# Patient Record
Sex: Female | Born: 1935 | Race: White | Hispanic: No | Marital: Married | Smoking: Former smoker
Health system: Southern US, Community
[De-identification: ages and names within clinical notes are randomized; demographics above are authoritative.]

## PROBLEM LIST (undated history)

## (undated) DIAGNOSIS — R918 Other nonspecific abnormal finding of lung field: Secondary | ICD-10-CM

## (undated) DIAGNOSIS — R768 Other specified abnormal immunological findings in serum: Secondary | ICD-10-CM

## (undated) DIAGNOSIS — I7300 Raynaud's syndrome without gangrene: Secondary | ICD-10-CM

## (undated) DIAGNOSIS — J383 Other diseases of vocal cords: Secondary | ICD-10-CM

## (undated) DIAGNOSIS — A310 Pulmonary mycobacterial infection: Secondary | ICD-10-CM

## (undated) DIAGNOSIS — K449 Diaphragmatic hernia without obstruction or gangrene: Secondary | ICD-10-CM

## (undated) DIAGNOSIS — G250 Essential tremor: Secondary | ICD-10-CM

## (undated) DIAGNOSIS — M8580 Other specified disorders of bone density and structure, unspecified site: Secondary | ICD-10-CM

## (undated) DIAGNOSIS — H04123 Dry eye syndrome of bilateral lacrimal glands: Secondary | ICD-10-CM

## (undated) DIAGNOSIS — D219 Benign neoplasm of connective and other soft tissue, unspecified: Secondary | ICD-10-CM

## (undated) HISTORY — DX: Diaphragmatic hernia without obstruction or gangrene: K44.9

## (undated) HISTORY — DX: Other diseases of vocal cords: J38.3

## (undated) HISTORY — DX: Dry eye syndrome of bilateral lacrimal glands: H04.123

## (undated) HISTORY — PX: DILATION AND CURETTAGE, DIAGNOSTIC / THERAPEUTIC: SUR384

## (undated) HISTORY — DX: Essential tremor: G25.0

## (undated) HISTORY — DX: Other specified abnormal immunological findings in serum: R76.8

## (undated) HISTORY — DX: Benign neoplasm of connective and other soft tissue, unspecified: D21.9

## (undated) HISTORY — PX: OTHER SURGICAL HISTORY: SHX169

## (undated) HISTORY — DX: Other specified disorders of bone density and structure, unspecified site: M85.80

## (undated) HISTORY — DX: Raynaud's syndrome without gangrene: I73.00

## (undated) HISTORY — DX: Pulmonary mycobacterial infection: A31.0

## (undated) HISTORY — DX: Other nonspecific abnormal finding of lung field: R91.8

---

## 1997-11-29 ENCOUNTER — Emergency Department (HOSPITAL_COMMUNITY): Admission: EM | Admit: 1997-11-29 | Discharge: 1997-11-29 | Payer: Self-pay | Admitting: Emergency Medicine

## 1997-12-12 ENCOUNTER — Emergency Department (HOSPITAL_COMMUNITY): Admission: EM | Admit: 1997-12-12 | Discharge: 1997-12-12 | Payer: Self-pay | Admitting: Internal Medicine

## 1998-07-08 ENCOUNTER — Other Ambulatory Visit: Admission: RE | Admit: 1998-07-08 | Discharge: 1998-07-08 | Payer: Self-pay | Admitting: Internal Medicine

## 1999-05-12 ENCOUNTER — Ambulatory Visit (HOSPITAL_COMMUNITY): Admission: RE | Admit: 1999-05-12 | Discharge: 1999-05-12 | Payer: Self-pay | Admitting: Obstetrics and Gynecology

## 1999-05-12 ENCOUNTER — Encounter: Payer: Self-pay | Admitting: Obstetrics and Gynecology

## 2000-10-18 ENCOUNTER — Other Ambulatory Visit: Admission: RE | Admit: 2000-10-18 | Discharge: 2000-10-18 | Payer: Self-pay | Admitting: Obstetrics and Gynecology

## 2001-07-13 ENCOUNTER — Ambulatory Visit: Admission: RE | Admit: 2001-07-13 | Discharge: 2001-07-13 | Payer: Self-pay | Admitting: Internal Medicine

## 2003-03-08 HISTORY — PX: COLONOSCOPY: SHX174

## 2004-01-22 ENCOUNTER — Ambulatory Visit: Payer: Self-pay | Admitting: Internal Medicine

## 2004-07-20 ENCOUNTER — Ambulatory Visit: Payer: Self-pay | Admitting: Internal Medicine

## 2004-09-13 ENCOUNTER — Ambulatory Visit: Payer: Self-pay | Admitting: Internal Medicine

## 2004-10-13 ENCOUNTER — Ambulatory Visit: Payer: Self-pay | Admitting: Internal Medicine

## 2005-01-13 ENCOUNTER — Ambulatory Visit: Payer: Self-pay | Admitting: Internal Medicine

## 2005-03-07 DIAGNOSIS — R768 Other specified abnormal immunological findings in serum: Secondary | ICD-10-CM

## 2005-03-07 HISTORY — DX: Other specified abnormal immunological findings in serum: R76.8

## 2005-03-15 ENCOUNTER — Ambulatory Visit: Payer: Self-pay | Admitting: Internal Medicine

## 2005-03-16 ENCOUNTER — Ambulatory Visit: Payer: Self-pay | Admitting: *Deleted

## 2005-03-25 ENCOUNTER — Ambulatory Visit: Payer: Self-pay | Admitting: Internal Medicine

## 2005-04-01 ENCOUNTER — Ambulatory Visit: Payer: Self-pay | Admitting: Critical Care Medicine

## 2005-04-11 ENCOUNTER — Ambulatory Visit: Payer: Self-pay | Admitting: Pulmonary Disease

## 2005-04-12 ENCOUNTER — Encounter (INDEPENDENT_AMBULATORY_CARE_PROVIDER_SITE_OTHER): Payer: Self-pay | Admitting: Specialist

## 2005-04-12 ENCOUNTER — Ambulatory Visit: Admission: RE | Admit: 2005-04-12 | Discharge: 2005-04-12 | Payer: Self-pay | Admitting: Critical Care Medicine

## 2005-04-12 ENCOUNTER — Ambulatory Visit: Payer: Self-pay | Admitting: Critical Care Medicine

## 2005-04-20 ENCOUNTER — Ambulatory Visit: Payer: Self-pay | Admitting: Critical Care Medicine

## 2005-05-04 ENCOUNTER — Ambulatory Visit: Payer: Self-pay | Admitting: Critical Care Medicine

## 2005-06-06 ENCOUNTER — Ambulatory Visit: Payer: Self-pay | Admitting: Critical Care Medicine

## 2005-06-15 ENCOUNTER — Ambulatory Visit: Payer: Self-pay | Admitting: Critical Care Medicine

## 2005-07-22 ENCOUNTER — Ambulatory Visit: Payer: Self-pay | Admitting: Critical Care Medicine

## 2005-08-15 ENCOUNTER — Ambulatory Visit: Payer: Self-pay | Admitting: Cardiology

## 2005-08-29 ENCOUNTER — Ambulatory Visit: Payer: Self-pay | Admitting: Critical Care Medicine

## 2005-09-27 ENCOUNTER — Ambulatory Visit: Payer: Self-pay | Admitting: Critical Care Medicine

## 2005-10-10 ENCOUNTER — Ambulatory Visit: Payer: Self-pay | Admitting: Critical Care Medicine

## 2005-10-11 ENCOUNTER — Ambulatory Visit: Payer: Self-pay | Admitting: Internal Medicine

## 2005-10-19 ENCOUNTER — Ambulatory Visit: Payer: Self-pay | Admitting: Internal Medicine

## 2005-11-22 ENCOUNTER — Ambulatory Visit: Payer: Self-pay | Admitting: Critical Care Medicine

## 2006-01-02 ENCOUNTER — Ambulatory Visit: Payer: Self-pay | Admitting: Critical Care Medicine

## 2006-01-17 ENCOUNTER — Ambulatory Visit: Payer: Self-pay | Admitting: Internal Medicine

## 2006-01-19 ENCOUNTER — Ambulatory Visit: Payer: Self-pay | Admitting: Internal Medicine

## 2006-02-06 ENCOUNTER — Ambulatory Visit: Payer: Self-pay | Admitting: Internal Medicine

## 2006-02-13 ENCOUNTER — Ambulatory Visit: Payer: Self-pay | Admitting: Internal Medicine

## 2006-02-16 ENCOUNTER — Encounter: Admission: RE | Admit: 2006-02-16 | Discharge: 2006-02-16 | Payer: Self-pay | Admitting: Internal Medicine

## 2006-02-22 ENCOUNTER — Encounter: Admission: RE | Admit: 2006-02-22 | Discharge: 2006-02-22 | Payer: Self-pay | Admitting: Internal Medicine

## 2006-02-22 ENCOUNTER — Ambulatory Visit: Payer: Self-pay | Admitting: Internal Medicine

## 2006-05-05 ENCOUNTER — Encounter: Admission: RE | Admit: 2006-05-05 | Discharge: 2006-05-05 | Payer: Self-pay | Admitting: Internal Medicine

## 2006-06-06 DIAGNOSIS — R918 Other nonspecific abnormal finding of lung field: Secondary | ICD-10-CM

## 2006-06-06 HISTORY — DX: Other nonspecific abnormal finding of lung field: R91.8

## 2006-06-12 DIAGNOSIS — D259 Leiomyoma of uterus, unspecified: Secondary | ICD-10-CM

## 2006-06-12 DIAGNOSIS — M949 Disorder of cartilage, unspecified: Secondary | ICD-10-CM

## 2006-06-12 DIAGNOSIS — G250 Essential tremor: Secondary | ICD-10-CM

## 2006-06-12 DIAGNOSIS — G252 Other specified forms of tremor: Secondary | ICD-10-CM

## 2006-06-12 DIAGNOSIS — M899 Disorder of bone, unspecified: Secondary | ICD-10-CM | POA: Insufficient documentation

## 2006-06-12 DIAGNOSIS — I7300 Raynaud's syndrome without gangrene: Secondary | ICD-10-CM | POA: Insufficient documentation

## 2006-06-12 DIAGNOSIS — F438 Other reactions to severe stress: Secondary | ICD-10-CM | POA: Insufficient documentation

## 2006-06-12 DIAGNOSIS — R930 Abnormal findings on diagnostic imaging of skull and head, not elsewhere classified: Secondary | ICD-10-CM

## 2006-06-28 ENCOUNTER — Ambulatory Visit: Payer: Self-pay | Admitting: Critical Care Medicine

## 2006-06-30 ENCOUNTER — Ambulatory Visit: Payer: Self-pay | Admitting: Cardiology

## 2006-11-20 ENCOUNTER — Telehealth (INDEPENDENT_AMBULATORY_CARE_PROVIDER_SITE_OTHER): Payer: Self-pay | Admitting: *Deleted

## 2006-12-18 ENCOUNTER — Ambulatory Visit: Payer: Self-pay | Admitting: Internal Medicine

## 2006-12-22 DIAGNOSIS — A310 Pulmonary mycobacterial infection: Secondary | ICD-10-CM

## 2007-02-08 ENCOUNTER — Ambulatory Visit: Payer: Self-pay | Admitting: Internal Medicine

## 2007-02-26 ENCOUNTER — Encounter: Payer: Self-pay | Admitting: Internal Medicine

## 2007-02-26 ENCOUNTER — Ambulatory Visit: Payer: Self-pay | Admitting: Internal Medicine

## 2007-02-26 DIAGNOSIS — R042 Hemoptysis: Secondary | ICD-10-CM | POA: Insufficient documentation

## 2007-02-28 ENCOUNTER — Encounter: Payer: Self-pay | Admitting: Internal Medicine

## 2007-02-28 ENCOUNTER — Ambulatory Visit: Payer: Self-pay | Admitting: Internal Medicine

## 2007-03-07 ENCOUNTER — Ambulatory Visit: Payer: Self-pay | Admitting: Pulmonary Disease

## 2007-03-07 ENCOUNTER — Ambulatory Visit: Payer: Self-pay | Admitting: Internal Medicine

## 2007-03-11 LAB — CONVERTED CEMR LAB
Albumin: 3.3 g/dL — ABNORMAL LOW (ref 3.5–5.2)
Alkaline Phosphatase: 75 units/L (ref 39–117)
Total Bilirubin: 0.5 mg/dL (ref 0.3–1.2)

## 2007-03-21 ENCOUNTER — Ambulatory Visit: Payer: Self-pay | Admitting: Internal Medicine

## 2007-03-28 ENCOUNTER — Telehealth: Payer: Self-pay | Admitting: Critical Care Medicine

## 2007-04-17 ENCOUNTER — Ambulatory Visit: Payer: Self-pay | Admitting: Cardiovascular Disease

## 2007-04-27 ENCOUNTER — Telehealth: Payer: Self-pay | Admitting: Critical Care Medicine

## 2007-04-27 ENCOUNTER — Ambulatory Visit: Payer: Self-pay | Admitting: Critical Care Medicine

## 2007-04-27 DIAGNOSIS — J383 Other diseases of vocal cords: Secondary | ICD-10-CM | POA: Insufficient documentation

## 2007-04-27 LAB — CONVERTED CEMR LAB
Bilirubin, Direct: 0.1 mg/dL (ref 0.0–0.3)
Eosinophils Absolute: 0.7 10*3/uL — ABNORMAL HIGH (ref 0.0–0.6)
Eosinophils Relative: 13.3 % — ABNORMAL HIGH (ref 0.0–5.0)
HCT: 38.1 % (ref 36.0–46.0)
Lymphocytes Relative: 20.9 % (ref 12.0–46.0)
MCV: 93.5 fL (ref 78.0–100.0)
Monocytes Absolute: 0.8 10*3/uL — ABNORMAL HIGH (ref 0.2–0.7)
Neutro Abs: 2.9 10*3/uL (ref 1.4–7.7)
Neutrophils Relative %: 51.3 % (ref 43.0–77.0)
Total Protein: 7.5 g/dL (ref 6.0–8.3)
WBC: 5.5 10*3/uL (ref 4.5–10.5)

## 2007-05-23 ENCOUNTER — Telehealth (INDEPENDENT_AMBULATORY_CARE_PROVIDER_SITE_OTHER): Payer: Self-pay | Admitting: *Deleted

## 2007-06-01 ENCOUNTER — Encounter: Payer: Self-pay | Admitting: Internal Medicine

## 2007-06-19 ENCOUNTER — Encounter: Payer: Self-pay | Admitting: Internal Medicine

## 2007-06-26 ENCOUNTER — Ambulatory Visit: Payer: Self-pay | Admitting: Critical Care Medicine

## 2007-06-26 LAB — CONVERTED CEMR LAB: Hemoglobin: 12.6 g/dL

## 2007-06-27 ENCOUNTER — Telehealth (INDEPENDENT_AMBULATORY_CARE_PROVIDER_SITE_OTHER): Payer: Self-pay | Admitting: *Deleted

## 2007-06-27 LAB — CONVERTED CEMR LAB
AST: 30 units/L (ref 0–37)
Albumin: 3.4 g/dL — ABNORMAL LOW (ref 3.5–5.2)
Alkaline Phosphatase: 63 units/L (ref 39–117)
BUN: 14 mg/dL (ref 6–23)
Bilirubin, Direct: 0.2 mg/dL (ref 0.0–0.3)
Chloride: 103 meq/L (ref 96–112)
Eosinophils Absolute: 0.5 10*3/uL (ref 0.0–0.7)
Eosinophils Relative: 12.1 % — ABNORMAL HIGH (ref 0.0–5.0)
GFR calc non Af Amer: 65 mL/min
MCV: 94.3 fL (ref 78.0–100.0)
Monocytes Relative: 8.6 % (ref 3.0–12.0)
Neutrophils Relative %: 45.7 % (ref 43.0–77.0)
Platelets: 261 10*3/uL (ref 150–400)
Potassium: 4.6 meq/L (ref 3.5–5.1)
Sodium: 137 meq/L (ref 135–145)
WBC: 4.4 10*3/uL — ABNORMAL LOW (ref 4.5–10.5)

## 2007-09-26 ENCOUNTER — Ambulatory Visit: Payer: Self-pay | Admitting: Critical Care Medicine

## 2007-09-26 LAB — CONVERTED CEMR LAB
Basophils Absolute: 0 10*3/uL (ref 0.0–0.1)
Bilirubin, Direct: 0.1 mg/dL (ref 0.0–0.3)
Eosinophils Absolute: 0.6 10*3/uL (ref 0.0–0.7)
HCT: 37.5 % (ref 36.0–46.0)
MCHC: 34.3 g/dL (ref 30.0–36.0)
MCV: 94.2 fL (ref 78.0–100.0)
Monocytes Absolute: 0.7 10*3/uL (ref 0.1–1.0)
Monocytes Relative: 13.7 % — ABNORMAL HIGH (ref 3.0–12.0)
Neutro Abs: 2.8 10*3/uL (ref 1.4–7.7)
Platelets: 284 10*3/uL (ref 150–400)
RDW: 13.5 % (ref 11.5–14.6)
Total Bilirubin: 0.6 mg/dL (ref 0.3–1.2)

## 2007-09-28 ENCOUNTER — Telehealth (INDEPENDENT_AMBULATORY_CARE_PROVIDER_SITE_OTHER): Payer: Self-pay | Admitting: *Deleted

## 2007-10-02 ENCOUNTER — Encounter: Payer: Self-pay | Admitting: Internal Medicine

## 2007-12-12 ENCOUNTER — Encounter: Payer: Self-pay | Admitting: Internal Medicine

## 2008-01-16 ENCOUNTER — Ambulatory Visit: Payer: Self-pay | Admitting: Internal Medicine

## 2008-02-22 ENCOUNTER — Encounter: Payer: Self-pay | Admitting: Internal Medicine

## 2008-05-09 ENCOUNTER — Encounter: Payer: Self-pay | Admitting: Internal Medicine

## 2008-05-12 ENCOUNTER — Encounter: Payer: Self-pay | Admitting: Internal Medicine

## 2008-08-27 ENCOUNTER — Encounter (INDEPENDENT_AMBULATORY_CARE_PROVIDER_SITE_OTHER): Payer: Self-pay | Admitting: *Deleted

## 2008-11-03 ENCOUNTER — Ambulatory Visit: Payer: Self-pay | Admitting: Internal Medicine

## 2008-11-03 DIAGNOSIS — J392 Other diseases of pharynx: Secondary | ICD-10-CM

## 2008-12-15 ENCOUNTER — Encounter: Payer: Self-pay | Admitting: Internal Medicine

## 2008-12-24 ENCOUNTER — Telehealth (INDEPENDENT_AMBULATORY_CARE_PROVIDER_SITE_OTHER): Payer: Self-pay | Admitting: *Deleted

## 2008-12-24 ENCOUNTER — Ambulatory Visit: Payer: Self-pay | Admitting: Family Medicine

## 2008-12-24 DIAGNOSIS — M25579 Pain in unspecified ankle and joints of unspecified foot: Secondary | ICD-10-CM

## 2009-01-13 ENCOUNTER — Encounter: Payer: Self-pay | Admitting: Internal Medicine

## 2009-06-05 ENCOUNTER — Encounter: Payer: Self-pay | Admitting: Internal Medicine

## 2009-11-02 ENCOUNTER — Encounter: Payer: Self-pay | Admitting: Internal Medicine

## 2009-12-02 ENCOUNTER — Ambulatory Visit: Payer: Self-pay | Admitting: Internal Medicine

## 2009-12-22 ENCOUNTER — Encounter: Payer: Self-pay | Admitting: Internal Medicine

## 2010-03-28 ENCOUNTER — Encounter: Payer: Self-pay | Admitting: Internal Medicine

## 2010-04-06 NOTE — Letter (Signed)
Summary: [facility]  [facility]   Imported By: Lanelle Bal 11/17/2009 09:41:14  _____________________________________________________________________  External Attachment:    Type:   Image     Comment:   External Document

## 2010-04-06 NOTE — Op Note (Signed)
Summary: Botox Injection/[facility]  Botox Injection/[facility]   Imported By: Lanelle Bal 06/15/2009 13:46:30  _____________________________________________________________________  External Attachment:    Type:   Image     Comment:   External Document

## 2010-04-06 NOTE — Op Note (Signed)
Summary: Botox Injection/[facility]  Botox Injection/[facility]   Imported By: Lanelle Bal 06/16/2009 11:16:15  _____________________________________________________________________  External Attachment:    Type:   Image     Comment:   External Document

## 2010-04-06 NOTE — Assessment & Plan Note (Signed)
Summary: FLU SHOT   Nurse Visit   Allergies: 1)  ! Cipro 2)  ! Pyridium  Orders Added: 1)  Flu Vaccine 44yrs + MEDICARE PATIENTS [Q2039] 2)  Administration Flu vaccine - MCR [G0008] Flu Vaccine Consent Questions     Do you have a history of severe allergic reactions to this vaccine? no    Any prior history of allergic reactions to egg and/or gelatin? no    Do you have a sensitivity to the preservative Thimersol? no    Do you have a past history of Guillan-Barre Syndrome? no    Do you currently have an acute febrile illness? no    Have you ever had a severe reaction to latex? no    Vaccine information given and explained to patient? yes    Are you currently pregnant? no    Lot Number:[id number]   Exp Date:09/04/2010   Site Given  Left Deltoid IM

## 2010-04-06 NOTE — Letter (Signed)
Summary: [facility]  [facility]   Imported By: Lanelle Bal 06/16/2009 11:16:54  _____________________________________________________________________  External Attachment:    Type:   Image     Comment:   External Document

## 2010-05-31 ENCOUNTER — Ambulatory Visit (INDEPENDENT_AMBULATORY_CARE_PROVIDER_SITE_OTHER): Payer: Medicare Other | Admitting: Adult Health

## 2010-05-31 ENCOUNTER — Telehealth: Payer: Self-pay | Admitting: Adult Health

## 2010-05-31 ENCOUNTER — Encounter: Payer: Self-pay | Admitting: Adult Health

## 2010-05-31 ENCOUNTER — Ambulatory Visit (INDEPENDENT_AMBULATORY_CARE_PROVIDER_SITE_OTHER)
Admission: RE | Admit: 2010-05-31 | Discharge: 2010-05-31 | Disposition: A | Discharge status: Home / Self Care | Payer: Medicare Other | Source: Ambulatory Visit | Attending: Adult Health | Admitting: Adult Health

## 2010-05-31 VITALS — BP 130/80 | HR 64 | Temp 99.1°F | Ht 65.0 in | Wt 125.0 lb

## 2010-05-31 DIAGNOSIS — J984 Other disorders of lung: Secondary | ICD-10-CM

## 2010-05-31 DIAGNOSIS — R042 Hemoptysis: Secondary | ICD-10-CM

## 2010-05-31 DIAGNOSIS — A310 Pulmonary mycobacterial infection: Secondary | ICD-10-CM

## 2010-05-31 MED ORDER — MOXIFLOXACIN HCL 400 MG PO TABS: 400.0000 mg | ORAL_TABLET | Freq: Every day | ORAL | Status: AC

## 2010-05-31 NOTE — Assessment & Plan Note (Signed)
Check sputum cx today cxr pending.

## 2010-05-31 NOTE — Progress Notes (Signed)
  Subjective:    Patient ID: Heather [name], female    DOB: 17-Feb-1930, 77 y.o.   MRN: [id number]  HPI Comments: 77 yo female with previous hx of  pulmonary MAC infection.    4/2009The patient has cycled off systemic antibiotics in September 2008.  Over the period of the fall of 2008 the patient did well until she began having hemoptysis over the holidays of 2008.  Patient was seen by my partner, on 03/21/07 , and a sputum culture was obtained, showing positivity for Mycobacteruim intracelluare avium infection.    initiated treatment for MAI with triple therapy on March 31, 2007.   CT scan of the chest does show disease in the right upper and left upper lung zones.  Left upper lung infiltrate has improved since starting therapy, but the right lung shows no change in fact, may be some progression in the apices of the right upper lobe. The pt has tolerated triple therapy well without any side effects.  Labs in 2/20 were normal.   Pt is seeking some assistance in paying for her chronic meds.  09/26/07 : Now improved with less cough.  no chest pain.  No f/c/s.  tolerating MAC meds ok.  05/31/2010 Acute OV--Pt walked in today for acute office visit. She was last seen in 2009. Says she has been doing well with no flare in cough/hemoptysis. Today she coughed up blood tinged mucus x 3. Mucus is mixed with red/brown blood. No known fever/chills however on arrival temp 99.1. She denies recent abx, travel or chest pain,or calf swelling/pain. No otc meds used.      Review of Systems   Constitutional:   No  weight loss, night sweats,  Fevers, chills, fatigue, lassitude. HEENT:   No headaches,  Difficulty swallowing,   Sore throat,                No sneezing, itching, ear ache, nasal congestion, post nasal drip,   CV:  No chest pain,  Orthopnea, PND, swelling in lower extremities, anasarca, dizziness, palpitations  GI  No heartburn, indigestion, abdominal pain, nausea, vomiting, diarrhea, change in bowel  habits, loss of appetite  Resp: No shortness of breath with exertion or at rest.  No excess mucus, no productive cough Skin: no rash or lesions.  GU: no dysuria, change in color of urine, no urgency or frequency.  No flank pain.  MS:  No joint pain or swelling.  No decreased range of motion.  No back pain.  Psych:  No change in mood or affect. No depression or anxiety.  No memory loss.                        Objective:   Physical Exam Gen: Pleasant, well-nourished, in no distress,  normal affect  ENT: No lesions,  mouth clear,  oropharynx clear, no postnasal drip, hoarse   Neck: No JVD, no TMG, no carotid bruits  Lungs: No use of accessory muscles, no dullness to percussion, clear without rales or rhonchi  Cardiovascular: RRR, heart sounds normal, no murmur or gallops, no peripheral edema  Abdomen: soft and NT, no HSM,  BS normal  Musculoskeletal: No deformities, no cyanosis or clubbing  Neuro: alert, non focal  Skin: Warm, no lesions or rashes             Assessment & Plan:

## 2010-05-31 NOTE — Patient Instructions (Addendum)
Avelox 400mg  daily for 7 days- take with food  Eat yogurt daily while on antibiotic  Mucinex DM Twice daily  For cough /congestion  Hold Aspirin if taking.  Hold fosamax.  We are going to check xray and sputum culture.  follow up in 1 week Dr. Delford Field  [place]

## 2010-05-31 NOTE — Assessment & Plan Note (Signed)
CXR pending  Begin Avelox x 7 days for possible PNA  Hold asa  And fosamax

## 2010-06-01 NOTE — Progress Notes (Signed)
Addended by: Rubye Oaks on: 06/01/2010 02:15 PM   Modules accepted: Orders

## 2010-06-02 ENCOUNTER — Other Ambulatory Visit: Payer: Medicare Other

## 2010-06-02 ENCOUNTER — Ambulatory Visit (INDEPENDENT_AMBULATORY_CARE_PROVIDER_SITE_OTHER)
Admission: RE | Admit: 2010-06-02 | Discharge: 2010-06-02 | Disposition: A | Discharge status: Home / Self Care | Payer: Medicare Other | Source: Ambulatory Visit | Attending: Adult Health | Admitting: Adult Health

## 2010-06-02 DIAGNOSIS — A310 Pulmonary mycobacterial infection: Secondary | ICD-10-CM

## 2010-06-02 DIAGNOSIS — J984 Other disorders of lung: Secondary | ICD-10-CM

## 2010-06-02 DIAGNOSIS — R042 Hemoptysis: Secondary | ICD-10-CM

## 2010-06-03 ENCOUNTER — Telehealth: Payer: Self-pay | Admitting: Critical Care Medicine

## 2010-06-03 NOTE — Telephone Encounter (Signed)
Dr. Delford Field, are you wanting to work pt in on 4/11?

## 2010-06-03 NOTE — Telephone Encounter (Signed)
CT chest abnormal and the patient needs to get in to see me very soon   06/16/10 is ok with me

## 2010-06-03 NOTE — Telephone Encounter (Signed)
Yes , work this patient into the schedule

## 2010-06-03 NOTE — Telephone Encounter (Signed)
Called, spoke with pt.  She was informed Dr. Delford Field would like her to come in for follow up.  OV scheduled for 06/16/10 at 10:30am with Dr. Delford Field.  Pt aware and will call back if something needed prior to this.

## 2010-06-03 NOTE — Telephone Encounter (Signed)
Pt saw TP on 05-31-10. Carron Curie, CMA

## 2010-06-07 NOTE — Progress Notes (Signed)
Pt to return cx

## 2010-06-07 NOTE — Progress Notes (Signed)
Pt did not return sputum cx , pt called and reminded to return

## 2010-06-15 ENCOUNTER — Encounter: Payer: Self-pay | Admitting: Critical Care Medicine

## 2010-06-16 ENCOUNTER — Other Ambulatory Visit (INDEPENDENT_AMBULATORY_CARE_PROVIDER_SITE_OTHER): Payer: Medicare Other

## 2010-06-16 ENCOUNTER — Encounter: Payer: Self-pay | Admitting: Critical Care Medicine

## 2010-06-16 ENCOUNTER — Ambulatory Visit (INDEPENDENT_AMBULATORY_CARE_PROVIDER_SITE_OTHER): Payer: Medicare Other | Admitting: Critical Care Medicine

## 2010-06-16 VITALS — BP 110/70 | HR 80 | Temp 98.3°F | Ht 65.0 in | Wt 125.6 lb

## 2010-06-16 DIAGNOSIS — A310 Pulmonary mycobacterial infection: Secondary | ICD-10-CM

## 2010-06-16 LAB — HEPATIC FUNCTION PANEL
AST: 25 U/L (ref 0–37)
Alkaline Phosphatase: 55 U/L (ref 39–117)
Bilirubin, Direct: 0.1 mg/dL (ref 0.0–0.3)

## 2010-06-16 MED ORDER — ETHAMBUTOL HCL 400 MG PO TABS: ORAL_TABLET | ORAL | Status: DC

## 2010-06-16 MED ORDER — AZITHROMYCIN 500 MG PO TABS: ORAL_TABLET | ORAL | Status: DC

## 2010-06-16 MED ORDER — RIFABUTIN 150 MG PO CAPS: ORAL_CAPSULE | ORAL | Status: DC

## 2010-06-16 NOTE — Progress Notes (Signed)
Quick Note:  Called, spoke with pt. She was informed labs are ok. No changed in meds, and ok to start new abx. She verbalized understanding of this and voiced no further questions at this time. ______

## 2010-06-16 NOTE — Progress Notes (Signed)
Quick Note:  Call pt and tell her labs are ok, No change in medications, ok to start new antibiotics ______

## 2010-06-16 NOTE — Progress Notes (Deleted)
  Subjective:    Patient ID: Heather [name], female    DOB: 02/06/1930, 77 y.o.   MRN: [id number]  HPI This is a 77 year old, white female, seen in return follow-up for pulmonary MAC infection. The patient has cycled off systemic antibiotics in September 2008. Over the period of the fall of 2008 the patient did well until she began having hemoptysis over the holidays of 2008. Patient was seen by my partner, on 03/21/07 , and a sputum culture was obtained, showing positivity for Mycobacteruim intracelluare avium infection. We initiated treatment for MAI with triple therapy on March 31, 2007. Since that time, the patient's head. No fever, chills, or sweats. There is no further hemoptysis. She is not short of breath. There is no chest pain. Patient returns for pulmonary follow-up and assessment. The last OV was 04/27/07. No new changes since the last OV is noted.  CT scan of the chest does show disease in the right upper and left upper lung zones. Left upper lung infiltrate has improved since starting therapy, but the right lung shows no change in fact, may be some progression in the apices of the right upper lobe.  The pt has tolerated triple therapy well without any side effects. Labs in 2/20 were normal. Pt is seeking some assistance in paying for her chronic meds.  7/22: Now improved with less cough. no chest pain. No f/c/s. tolerating MAC meds ok.   06/16/2010 Not seen since 2009 as above; Since last ov 2009, was on mycobutin/zmax/eth and took for 7months.  2009 ended.  Was ok until noted more cough and produced bloody mucus.  Saw NP    Review of Systems Constitutional:   No  weight loss, night sweats,  Fevers, chills, fatigue, lassitude. HEENT:   No headaches,  Difficulty swallowing,  Tooth/dental problems,  Sore throat,                No sneezing, itching, ear ache, nasal congestion, post nasal drip,   CV:  No chest pain,  Orthopnea, PND, swelling in lower extremities, anasarca, dizziness,  palpitations  GI  No heartburn, indigestion, abdominal pain, nausea, vomiting, diarrhea, change in bowel habits, loss of appetite  Resp: No shortness of breath with exertion or at rest.  No excess mucus, no productive cough,  No non-productive cough,  No coughing up of blood.  No change in color of mucus.  No wheezing.  No chest wall deformity  Skin: no rash or lesions.  GU: no dysuria, change in color of urine, no urgency or frequency.  No flank pain.  MS:  No joint pain or swelling.  No decreased range of motion.  No back pain.  Psych:  No change in mood or affect. No depression or anxiety.  No memory loss.     Objective:   Physical Exam Gen: Pleasant, well-nourished, in no distress,  normal affect  ENT: No lesions,  mouth clear,  oropharynx clear, no postnasal drip  Neck: No JVD, no TMG, no carotid bruits  Lungs: No use of accessory muscles, no dullness to percussion, clear without rales or rhonchi  Cardiovascular: RRR, heart sounds normal, no murmur or gallops, no peripheral edema  Abdomen: soft and NT, no HSM,  BS normal  Musculoskeletal: No deformities, no cyanosis or clubbing  Neuro: alert, non focal  Skin: Warm, no lesions or rashes        Assessment & Plan:

## 2010-06-16 NOTE — Patient Instructions (Signed)
We will start Mycobutin 4 tabs three times weekly,  Myambutol 3 tabs three times weekly,  zithromax one three times weekly Labs today Return 6weeks

## 2010-06-16 NOTE — Progress Notes (Signed)
Subjective:    Patient ID: Heather [name], female    DOB: 1929/04/18, 77 y.o.   MRN: [id number]  HPI Comments: 77 yo female with previous hx of  pulmonary MAC infection.    4/2009The patient has cycled off systemic antibiotics in September 2008.  Over the period of the fall of 2008 the patient did well until she began having hemoptysis over the holidays of 2008.  Patient was seen by my partner, on 03/21/07 , and a sputum culture was obtained, showing positivity for Mycobacteruim intracelluare avium infection.    initiated treatment for MAI with triple therapy on March 31, 2007.   CT scan of the chest does show disease in the right upper and left upper lung zones.  Left upper lung infiltrate has improved since starting therapy, but the right lung shows no change in fact, may be some progression in the apices of the right upper lobe. The pt has tolerated triple therapy well without any side effects.  Labs in 2/20 were normal.   Pt is seeking some assistance in paying for her chronic meds.  09/26/07 : Now improved with less cough.  no chest pain.  No f/c/s.  tolerating MAC meds ok.  05/31/2010 Acute OV--Pt walked in today for acute office visit. She was last seen in 2009. Says she has been doing well with no flare in cough/hemoptysis. Today she coughed up blood tinged mucus x 3. Mucus is mixed with red/brown blood. No known fever/chills however on arrival temp 99.1. She denies recent abx, travel or chest pain,or calf swelling/pain. No otc meds used.   Cough Associated symptoms include shortness of breath.  Shortness of Breath   06/16/2010 At last ov saw NP and rx avelox  .  Since that OV.  Still has phlegm, yellow to clear.  No blood since one time.  Had a CT chest and CXR CT was abn. As follows: Comparison: 04/17/2007  Findings: No enlarged axillary or supraclavicular lymph nodes.  No enlarged mediastinal or hilar lymph nodes identified.  No pericardial or pleural effusion identified.  Multifocal  patchy areas of consolidation and tree in bud nodularity  is again noted in both lungs. Associated cylindrical type  bronchiectasis is identified within these areas. Progressive  consolidative change, nodularity and bronchiectasis is noted within  the medial right upper lobe. Consolidative change within the  periphery of the right upper lobe demonstrates new area of  cavitation which measures 1.3 x 0.9 cm.  The trachea remains patent and is midline.  Review of the visualized osseous structures is significant for  multilevel degenerative disc disease within the thoracic spine.  No worrisome lytic or sclerotic lesions identified.  IMPRESSION:  1. Progressive pulmonary parenchymal opacities, tree in bud  densities and cylindrical bronchiectasis in both lungs. Findings  likely reflect progressive mycobacterium infection.  2. No specific features identified to suggest malignancy. Chronic  changes within the right middle lobe likely related to atypical  infection.  Pt notes still dyspneic and still with cough productive white mucus, no further hemoptysis Past Medical History  Diagnosis Date  . Hemoptysis   . 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 Premenstrual dysphoria   . Premenstrual dysphoria   . Raynaud's disease   . Fibroids uterine  . Tremor, essential   . Osteopenia   . Pulmonary infiltrates april 2008    persistent ct infiltrates     History reviewed. No pertinent family history.   History   Social History  . Marital  Status: Married    Spouse Name: N/A    Number of Children: N/A  . Years of Education: N/A   Occupational History  . Not on file.   Social History Main Topics  . Smoking status: Former Smoker -- 0.5 packs/day for 18 years    Types: Cigarettes    Quit date: 03/07/1968  . Smokeless tobacco: Never Used   Comment: started smoking at age 70  . Alcohol Use: No  . Drug Use: Not on file  . Sexually Active: Not on file   Other Topics Concern  . Not on  file   Social History Narrative  . No narrative on file     Allergies  Allergen Reactions  . Ciprofloxacin   . Phenazopyridine Hcl      Outpatient Prescriptions Prior to Visit  Medication Sig Dispense Refill  . alendronate (FOSAMAX) 70 MG tablet Take 70 mg by mouth once a week. Take with a full glass of water on an empty stomach.       . Biotin 1000 MCG tablet Take 1,000 mcg by mouth daily.        . Calcium Carb-Cholecalciferol (CALCIUM 1000 + D PO) Take 1 tablet by mouth daily.        . Cholecalciferol (VITAMIN D3) 1000 UNITS CAPS Take 1 capsule by mouth daily.        . cycloSPORINE (RESTASIS) 0.05 % ophthalmic emulsion Place 1 drop into both eyes every 12 (twelve) hours.        . Multiple Vitamins-Minerals (MULTIVITAMIN WITH MINERALS) tablet Take 1 tablet by mouth daily.        . Omega-3 Fatty Acids (FISH OIL) 1000 MG CAPS Take 1 capsule by mouth 2 (two) times daily.        . primidone (MYSOLINE) 250 MG tablet Take 250 mg by mouth daily.        . propranolol (INDERAL LA) 80 MG 24 hr capsule Take 80 mg by mouth daily.        . Selenium 200 MCG TABS Take 1 tablet by mouth daily.             Review of Systems  Respiratory: Positive for cough and shortness of breath.      Constitutional:   No  weight loss, night sweats,  Fevers, chills, fatigue, lassitude. HEENT:   No headaches,  Difficulty swallowing,   Sore throat,                No sneezing, itching, ear ache, nasal congestion, post nasal drip,   CV:  No chest pain,  Orthopnea, PND, swelling in lower extremities, anasarca, dizziness, palpitations  GI  No heartburn, indigestion, abdominal pain, nausea, vomiting, diarrhea, change in bowel habits, loss of appetite  Resp: Notes  shortness of breath with exertion not  at rest.  No excess mucus, notes a  productive cough Skin: no rash or lesions.  GU: no dysuria, change in color of urine, no urgency or frequency.  No flank pain.  MS:  No joint pain or swelling.  No decreased  range of motion.  No back pain.  Psych:  No change in mood or affect. No depression or anxiety.  No memory loss    Objective:   Physical Exam  Gen: Pleasant, well-nourished, in no distress,  normal affect  ENT: No lesions,  mouth clear,  oropharynx clear, no postnasal drip, hoarse   Neck: No JVD, no TMG, no carotid bruits  Lungs: No use of accessory  muscles, no dullness to percussion,  Distant BS  Cardiovascular: RRR, heart sounds normal, no murmur or gallops, no peripheral edema  Abdomen: soft and NT, no HSM,  BS normal  Musculoskeletal: No deformities, no cyanosis or clubbing  Neuro: alert, non focal  Skin: Warm, no lesions or rashes        Assessment & Plan:   DISEASE, PULMONARY D/T MYCOBACTERIA The patient has cycled off systemic antibiotics in September 2008.  03/21/07 , and a sputum culture was obtained, showing positivity for Mycobacteruim intracelluare avium infection. initiated treatment for MAI with triple therapy on March 31, 2007.  CT scan of the chest does show disease in the right upper and left upper lung zones. Left upper lung infiltrate has improved since starting therapy, but the right lung shows no change in fact, may be some progression in the apices of the right upper lobe.     MAI recurrence Plan Resume Mycobutin/ETH/ZMAX thrice weekly Baseline LFTs  Rov 6 weeks for recheck    Updated Medication List Outpatient Encounter Prescriptions as of 06/16/2010  Medication Sig Dispense Refill  . alendronate (FOSAMAX) 70 MG tablet Take 70 mg by mouth once a week. Take with a full glass of water on an empty stomach.       . Biotin 1000 MCG tablet Take 1,000 mcg by mouth daily.        . Calcium Carb-Cholecalciferol (CALCIUM 1000 + D PO) Take 1 tablet by mouth daily.        . Carboxymethylcellulose Sodium (REFRESH OP) Apply 2 drops to eye every 2 (two) hours.        . Cholecalciferol (VITAMIN D3) 1000 UNITS CAPS Take 1 capsule by mouth daily.        .  cycloSPORINE (RESTASIS) 0.05 % ophthalmic emulsion Place 1 drop into both eyes every 12 (twelve) hours.        . Multiple Vitamins-Minerals (MULTIVITAMIN WITH MINERALS) tablet Take 1 tablet by mouth daily.        . Omega-3 Fatty Acids (FISH OIL) 1000 MG CAPS Take 1 capsule by mouth 2 (two) times daily.        . primidone (MYSOLINE) 250 MG tablet Take 250 mg by mouth daily.        . propranolol (INDERAL LA) 80 MG 24 hr capsule Take 80 mg by mouth daily.        . Selenium 200 MCG TABS Take 1 tablet by mouth daily.        Marland Kitchen azithromycin (ZITHROMAX) 500 MG tablet One capsule three times weekly  40 tablet  6  . ethambutol (MYAMBUTOL) 400 MG tablet Take three capsules three times weekly  40 tablet  6  . rifabutin (MYCOBUTIN) 150 MG capsule Take 4 capsules three times weekly  50 capsule  6

## 2010-06-16 NOTE — Assessment & Plan Note (Addendum)
The patient has cycled off systemic antibiotics in September 2008.  03/21/07 , and a sputum culture was obtained, showing positivity for Mycobacteruim intracelluare avium infection. initiated treatment for MAI with triple therapy on March 31, 2007.  CT scan of the chest does show disease in the right upper and left upper lung zones. Left upper lung infiltrate has improved since starting therapy, but the right lung shows no change in fact, may be some progression in the apices of the right upper lobe.     MAI recurrence Plan Resume Mycobutin/ETH/ZMAX thrice weekly Baseline LFTs  Rov 6 weeks for recheck

## 2010-07-19 ENCOUNTER — Other Ambulatory Visit (INDEPENDENT_AMBULATORY_CARE_PROVIDER_SITE_OTHER): Payer: Medicare Other

## 2010-07-19 ENCOUNTER — Ambulatory Visit (INDEPENDENT_AMBULATORY_CARE_PROVIDER_SITE_OTHER): Payer: Medicare Other | Admitting: Critical Care Medicine

## 2010-07-19 ENCOUNTER — Encounter: Payer: Self-pay | Admitting: Critical Care Medicine

## 2010-07-19 VITALS — BP 124/74 | HR 65 | Temp 97.9°F | Ht 64.0 in | Wt 124.0 lb

## 2010-07-19 DIAGNOSIS — A310 Pulmonary mycobacterial infection: Secondary | ICD-10-CM

## 2010-07-19 LAB — CBC WITH DIFFERENTIAL/PLATELET
Basophils Absolute: 0 10*3/uL (ref 0.0–0.1)
Eosinophils Absolute: 0.4 10*3/uL (ref 0.0–0.7)
HCT: 39.5 % (ref 36.0–46.0)
Hemoglobin: 13.6 g/dL (ref 12.0–15.0)
Lymphs Abs: 1.6 10*3/uL (ref 0.7–4.0)
MCHC: 34.4 g/dL (ref 30.0–36.0)
Neutro Abs: 2.9 10*3/uL (ref 1.4–7.7)
RDW: 13.8 % (ref 11.5–14.6)

## 2010-07-19 LAB — HEPATIC FUNCTION PANEL
ALT: 43 U/L — ABNORMAL HIGH (ref 0–35)
AST: 35 U/L (ref 0–37)
Albumin: 3.4 g/dL — ABNORMAL LOW (ref 3.5–5.2)
Alkaline Phosphatase: 70 U/L (ref 39–117)
Bilirubin, Direct: 0 mg/dL (ref 0.0–0.3)
Total Bilirubin: 0.4 mg/dL (ref 0.3–1.2)
Total Protein: 7.2 g/dL (ref 6.0–8.3)

## 2010-07-19 NOTE — Progress Notes (Signed)
Subjective:    Patient ID: Heather [name], female    DOB: 1929/06/23, 77 y.o.   MRN: [id number]  HPI 05/31/2010 Acute OV--Pt walked in today for acute office visit. She was last seen in 2009. Says she has been doing well with no flare in cough/hemoptysis. Today she coughed up blood tinged mucus x 3. Mucus is mixed with red/brown blood. No known fever/chills however on arrival temp 99.1. She denies recent abx, travel or chest pain,or calf swelling/pain. No otc meds used.    07/19/2010 No further blood.  Tol the medications well.  No real chest pain.  No fever.   Pt denies any significant sore throat, nasal congestion or excess secretions, fever, chills, sweats, unintended weight loss, pleurtic or exertional chest pain, orthopnea PND, or leg swelling Pt denies any increase in rescue therapy over baseline, denies waking up needing it or having any early am or nocturnal exacerbations of coughing/wheezing/or dyspnea. Pt also denies any obvious fluctuation in symptoms with  weather or environmental change or other alleviating or aggravating factors  Past Medical History  Diagnosis Date  . Hemoptysis   . 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 Premenstrual dysphoria   . Premenstrual dysphoria   . Raynaud's disease   . Fibroids uterine  . Tremor, essential   . Osteopenia   . Pulmonary infiltrates april 2008    persistent ct infiltrates     No family history on file.   History   Social History  . Marital Status: Married    Spouse Name: N/A    Number of Children: N/A  . Years of Education: N/A   Occupational History  . Not on file.   Social History Main Topics  . Smoking status: Former Smoker -- 0.5 packs/day for 18 years    Types: Cigarettes    Quit date: 03/07/1965  . Smokeless tobacco: Never Used   Comment: started smoking at age 78  . Alcohol Use: No  . Drug Use: Not on file  . Sexually Active: Not on file   Other Topics Concern  . Not on file   Social History Narrative    . No narrative on file     Allergies  Allergen Reactions  . Ciprofloxacin   . Phenazopyridine Hcl      Outpatient Prescriptions Prior to Visit  Medication Sig Dispense Refill  . alendronate (FOSAMAX) 70 MG tablet Take 70 mg by mouth once a week. Take with a full glass of water on an empty stomach.       Marland Kitchen azithromycin (ZITHROMAX) 500 MG tablet One capsule three times weekly  40 tablet  6  . Biotin 1000 MCG tablet Take 1,000 mcg by mouth daily.        . Calcium Carb-Cholecalciferol (CALCIUM 1000 + D PO) Take 1 tablet by mouth daily.        . Carboxymethylcellulose Sodium (REFRESH OP) Apply to eye. Refresh ointment at bedtime      . Cholecalciferol (VITAMIN D3) 1000 UNITS CAPS Take 1 capsule by mouth daily.        . cycloSPORINE (RESTASIS) 0.05 % ophthalmic emulsion Place 1 drop into both eyes every 12 (twelve) hours.        Marland Kitchen ethambutol (MYAMBUTOL) 400 MG tablet Take three capsules three times weekly  40 tablet  6  . Multiple Vitamins-Minerals (MULTIVITAMIN WITH MINERALS) tablet Take 1 tablet by mouth daily.        . Omega-3 Fatty Acids (FISH OIL) 1000 MG  CAPS Take 1 capsule by mouth 2 (two) times daily.        . primidone (MYSOLINE) 250 MG tablet Take 250 mg by mouth daily.        . propranolol (INDERAL LA) 80 MG 24 hr capsule Take 80 mg by mouth daily.        . rifabutin (MYCOBUTIN) 150 MG capsule Take 4 capsules three times weekly  50 capsule  6  . Selenium 200 MCG TABS Take 1 tablet by mouth daily.            Review of Systems Constitutional:   No  weight loss, night sweats,  Fevers, chills, fatigue, lassitude. HEENT:   No headaches,  Difficulty swallowing,  Tooth/dental problems,  Sore throat,                No sneezing, itching, ear ache, nasal congestion, post nasal drip,   CV:  No chest pain,  Orthopnea, PND, swelling in lower extremities, anasarca, dizziness, palpitations  GI  No heartburn, indigestion, abdominal pain, nausea, vomiting, diarrhea, change in bowel habits,  loss of appetite  Resp: No shortness of breath with exertion or at rest.  No excess mucus, no productive cough,  No non-productive cough,  No coughing up of blood.  No change in color of mucus.  No wheezing.  No chest wall deformity  Skin: no rash or lesions.  GU: no dysuria, change in color of urine, no urgency or frequency.  No flank pain.  MS:  No joint pain or swelling.  No decreased range of motion.  No back pain.  Psych:  No change in mood or affect. No depression or anxiety.  No memory loss.     Objective:   Physical Exam Filed Vitals:   07/19/10 1347  BP: 124/74  Pulse: 65  Temp: 97.9 F (36.6 C)  TempSrc: Oral  Height: 5\' 4"  (1.626 m)  Weight: 124 lb (56.246 kg)  SpO2: 99%    Gen: Pleasant, well-nourished, in no distress,  normal affect  ENT: No lesions,  mouth clear,  oropharynx clear, no postnasal drip  Neck: No JVD, no TMG, no carotid bruits  Lungs: No use of accessory muscles, no dullness to percussion, distant BS,  Cardiovascular: RRR, heart sounds normal, no murmur or gallops, no peripheral edema  Abdomen: soft and NT, no HSM,  BS normal  Musculoskeletal: No deformities, no cyanosis or clubbing  Neuro: alert, non focal  Skin: Warm, no lesions or rashes        Assessment & Plan:   DISEASE, PULMONARY D/T MYCOBACTERIA The patient has cycled off systemic antibiotics in September 2008.  03/21/07 , and a sputum culture was obtained, showing positivity for Mycobacteruim intracelluare avium infection. initiated treatment for MAI with triple therapy on March 31, 2007.  CT scan of the chest does show disease in the right upper and left upper lung zones. Left upper lung infiltrate has improved since starting therapy, but the right lung shows no change in fact, may be some progression in the apices of the right upper lobe.   Symptoms now improved on therapy, hemoptysis has resolved   MAI recurrence improved Plan Cont  Mycobutin/ETH/ZMAX thrice  weekly Note CBC and  LFTs normal today 07/19/10 Rov 2  months  for recheck      Updated Medication List Outpatient Encounter Prescriptions as of 07/19/2010  Medication Sig Dispense Refill  . alendronate (FOSAMAX) 70 MG tablet Take 70 mg by mouth once a week. Take with  a full glass of water on an empty stomach.       Marland Kitchen azithromycin (ZITHROMAX) 500 MG tablet One capsule three times weekly  40 tablet  6  . Biotin 1000 MCG tablet Take 1,000 mcg by mouth daily.        . Calcium Carb-Cholecalciferol (CALCIUM 1000 + D PO) Take 1 tablet by mouth daily.        . Carboxymethylcellulose Sodium (REFRESH OP) Apply to eye. Refresh ointment at bedtime      . Cholecalciferol (VITAMIN D3) 1000 UNITS CAPS Take 1 capsule by mouth daily.        . cycloSPORINE (RESTASIS) 0.05 % ophthalmic emulsion Place 1 drop into both eyes every 12 (twelve) hours.        Marland Kitchen ethambutol (MYAMBUTOL) 400 MG tablet Take three capsules three times weekly  40 tablet  6  . Multiple Vitamins-Minerals (MULTIVITAMIN WITH MINERALS) tablet Take 1 tablet by mouth daily.        . Omega-3 Fatty Acids (FISH OIL) 1000 MG CAPS Take 1 capsule by mouth 2 (two) times daily.        . primidone (MYSOLINE) 250 MG tablet Take 250 mg by mouth daily.        . propranolol (INDERAL LA) 80 MG 24 hr capsule Take 80 mg by mouth daily.        . rifabutin (MYCOBUTIN) 150 MG capsule Take 4 capsules three times weekly  50 capsule  6  . Selenium 200 MCG TABS Take 1 tablet by mouth daily.

## 2010-07-19 NOTE — Progress Notes (Signed)
Quick Note:  Call pt and tell her labs are ok, No change in medications ______ 

## 2010-07-19 NOTE — Progress Notes (Signed)
Quick Note:  Called, spoke with pt. She was informed of lab results and recs per PW. She verbalized understanding of this. ______

## 2010-07-19 NOTE — Assessment & Plan Note (Addendum)
The patient has cycled off systemic antibiotics in September 2008.  03/21/07 , and a sputum culture was obtained, showing positivity for Mycobacteruim intracelluare avium infection. initiated treatment for MAI with triple therapy on March 31, 2007.  CT scan of the chest does show disease in the right upper and left upper lung zones. Left upper lung infiltrate has improved since starting therapy, but the right lung shows no change in fact, may be some progression in the apices of the right upper lobe.   Symptoms now improved on therapy, hemoptysis has resolved   MAI recurrence improved Plan Cont  Mycobutin/ETH/ZMAX thrice weekly Note CBC and  LFTs normal today 07/19/10 Rov 2  months  for recheck

## 2010-07-19 NOTE — Patient Instructions (Signed)
No change in medications. Return in        2 months Labs today

## 2010-07-23 NOTE — Assessment & Plan Note (Signed)
[facility]                                 ON-CALL NOTE   NAME:Heather [name], Heather [name]                            MRN:          [id number]  DATE:02/22/2006                            DOB:          1929-04-04    Time received is 7:51 a.m.  Telephone [phone].   Patient is in a lot of pain with sciatica.  She says her current pain  medications do not help, and she is also asking for results of x-rays  she had of her back last week.  My response is to contact Dr. Alwyn Ren at  the office later this morning during normal business hours.     Tera Mater. Clent Ridges, MD  Electronically Signed    SAF/MedQ  DD: 02/22/2006  DT: 02/22/2006  Job #: [id number]

## 2010-07-23 NOTE — Assessment & Plan Note (Signed)
[place] [facility]                               PULMONARY OFFICE NOTE   NAME:[name], [name]                          MRN:          [id number]  DATE:01/02/2006                            DOB:          [birth year]    Mrs. [name] returns today in followup for her Mycobacterium avium-  intracellulare infection with subsequent right upper lobe involvement.  She  has been on the thrice weekly therapy of Myambutol 1200 mg, Zithromax 500  mg, and Rifabutin 600 mg on Monday, Wednesday, and Saturday.  She has done  well with this and she is now completing her course of therapy as of  November 1.  She is asymptomatic at this time with no respiratory complaints  and no other active pulmonary complaints at this time are noted.   EXAM:  Temp 97.9.  Blood pressure 120/66.  Pulse 66.  Saturation 98% on room  air.  CHEST:  Completely clear without evidence of any adventitious breath sounds.  CARDIAC:  Regular rate and rhythm without S3.  Normal S1, S2.  ABDOMEN:  Soft, nontender.  EXTREMITIES:  No edema or clubbing.  SKIN:  Clear.   IMPRESSION:  Resolved right upper lobe Mycobacterium avium-intracellulare  infection.   PLAN:  Doreatha Martin a course of therapy on November 1 and we will follow the  patient back up expectantly with a repeat visit.     Charlcie Cradle Delford Field, MD, FCCP    PEW/MedQ  DD: 01/02/2006  DT: 01/03/2006  Job #: [id number]   cc:   Titus Dubin. Alwyn Ren, MD,FACP,FCCP

## 2010-07-23 NOTE — Assessment & Plan Note (Signed)
[facility]                                 ON-CALL NOTE   NAME:[name], Heather                            MRN:          [id number]  DATE:03/11/2006                            DOB:          Feb 21, 1930    Called from [phone] at 11:54 a.m. on March 11, 2006 claiming she had  cystitis and wanted some antibiotics called in.  The nurse called her  back and explained that we did not call in antibiotics without seeing  patients, then the patient stated she had some leftover medication from  a previous urinary tract infection, she would take that and go to Urgent  Care if symptoms did not improve.     Lelon Perla, DO  Electronically Signed    Shawnie Dapper  DD: 03/11/2006  DT: 03/11/2006  Job #: [id number]   cc:   Titus Dubin. Alwyn Ren, MD,FACP,FCCP

## 2010-07-23 NOTE — Assessment & Plan Note (Signed)
[facility]                                 ON-CALL NOTE   NAME:Heather [name], Heather [name]                            MRN:          [id number]  DATE:02/04/2006                            DOB:          [birth year]    PRIMARY CARE DOCTOR:  Dr. Alwyn Ren.   PHONE NUMBER:  [phone]   SUBJECTIVE:  Ms. Heather [name] is an elderly female with a history of sciatica in  the past who had a recurrence of her symptoms of sciatica approximately  24 hours ago.  She states that she has pain in her posterior hip and  down her left leg.  She denies weakness, numbness, incontinence, or  fever and chills.  She took Advil and Tylenol and it did not seem to  help.  She plans on having an appointment with her primary care doctor  on Monday, but states that she cannot tolerate the pain until then.   ASSESSMENT AND PLAN:  The patient encouraged to have full physical exam  on Monday to determine if this is sciatica.  Until that time, a  prescription for diclofenac 75 mg p.o. b.i.d. #30 was called to her  pharmacy.  She was instructed that, if she has weakness, numbness,  incontinence, fever, or chills, or if the pain is not well-controlled by  diclofenac, to be seen at an Urgent Care or emergency room center.     Kerby Nora, MD  Electronically Signed    AB/MedQ  DD: 02/04/2006  DT: 02/05/2006  Job #: [id number]

## 2010-07-23 NOTE — Assessment & Plan Note (Signed)
[place] [facility]                             PULMONARY OFFICE NOTE   NAME:[name], [name]                          MRN:          [id number]  DATE:06/28/2006                            DOB:          09/14/1929    Heather [name] is a 77 year old white female with a history of Mycobacterium  avium-intercellular infection. She received a full course of therapy  with rifabutin, Myambutol and Zithromax. She has done well since these  drugs were discontinued in October 2007 with no active cough, chest  congestion, or wheezing.   PHYSICAL EXAMINATION:  VITAL SIGNS:  Temperature 98, blood pressure  110/64, pulse 56, saturation 99% on room air.  CHEST:  Showed distant breath sounds with no evidence of wheeze or  rhonchi.  CARDIAC:  Showed a regular rate and rhythm without S3, normal S1, S2.  ABDOMEN:  Soft, nontender.  EXTREMITIES:  Showed no edema or clubbing.  SKIN:  Clear.   IMPRESSION:  The patient has resolved Mycobacterium avium infection and  is stable at this time from a pulmonary standpoint.   PLAN:  Plan is for the patient to maintain off any microbacterial  therapy at this time and will obtain a CT scan of the chest to followup  and compare with previous studies obtained in 2007. Will return the  patient back for a recheck in 6 months.     Charlcie Cradle Delford Field, MD, [facility]  Electronically Signed    PEW/MedQ  DD: 06/28/2006  DT: 06/28/2006  Job #: [id number]   cc:   Titus Dubin. Alwyn Ren, MD,FACP,FCCP

## 2010-07-23 NOTE — Op Note (Signed)
NAMEMARELI, ANTUNES                   ACCOUNT NO.:  [id number]   MEDICAL RECORD NO.:  [id number]          PATIENT TYPE:  AMB   LOCATION:  CARD                         FACILITY:  [facility]   PHYSICIAN:  Shan Levans, M.D. LHCDATE OF BIRTH:  [birth year]   DATE OF PROCEDURE:  04/12/2005  DATE OF DISCHARGE:                                 OPERATIVE REPORT   PROCEDURE:  Pulmonary bronchoscopy.   INDICATIONS FOR PROCEDURE:  Evaluate hemoptysis right upper lobe infiltrate.   SURGEON:  Shan Levans, M.D.   ANESTHESIA:  1% Xylocaine local.   PREOP MEDICATION:  Demerol 30 mg, Versed 3 mg IV push.   DESCRIPTION OF PROCEDURE:  The Pentax video bronchoscope was introduced via  the right naris, the upper airways were visualized and were unremarkable.  The entire tracheobronchial tree was visualized and revealed diffuse  tracheobronchitis but no endobronchial lesions were seen. Attention was then  paid to the right upper lobe, transbronchial biopsies x5 were obtained,  bronchial washings were obtained.   COMPLICATIONS:  None.   IMPRESSION:  Right upper lobe infiltrate with chronic infiltrative changes,  rule out vasculitis, rule out chronic airway infection.   RECOMMENDATIONS:  Followup pathology and microbiology.      Shan Levans, M.D. [facility]  Electronically Signed     PW/MEDQ  D:  04/12/2005  T:  04/12/2005  Job:  [id number]   cc:   Titus Dubin. Alwyn Ren, M.D. [facility]  [phone] [place]  [place] [address]

## 2010-07-23 NOTE — Assessment & Plan Note (Signed)
[place] [facility]                               PULMONARY OFFICE NOTE   NAME:Lal, Thersea K                          MRN:          [id number]  DATE:09/27/2005                            DOB:          1929/05/01    Ms. 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  We had had this patient on  Myambutol 1200 mg daily, rifabutin 300 mg daily and Zithromax 500 mg daily  since February of 2007.  She is here today to discuss alterations in her  drug regimen to a three times weekly program.  She is having no respiratory  complaints.  CT scan from June showed remarkable reduction in inflammation  in the right lung.  Liver functions have been stable on therapy.   PHYSICAL EXAMINATION:  VITAL SIGNS:  Temperature is 98, blood pressure is  104/72, pulse 61, saturation 98% on room air.  CHEST:  Distant breath sounds with no wheeze or rhonchi.  CARDIAC:  Regular rate and rhythm without S3.  Normal S1 and S2.  ABDOMEN:  Soft, nontender.  Bowel sounds active.  EXTREMITIES:  No edema, clubbing or venous disease.  SKIN:  Clear.   IMPRESSION:  Improved Mycobacterium avium intracellulare infection with  reduction in bronchial inflammation.   PLAN:  Switch rifabutin to 600 mg Monday, Wednesday and Saturday, Myambutol  to 1200 mg, and Zithromax 500 mg both Monday, Wednesday and Saturday.  Will  see the patient back in 3 months, and the plan is to complete the course of  treatment in November of 2007.                                   Charlcie Cradle Delford Field, MD, FCCP   PEW/MedQ  DD:  09/27/2005  DT:  09/28/2005  Job #:  [id number]   cc:   Titus Dubin. Alwyn Ren, MD, FCCP

## 2010-09-23 ENCOUNTER — Other Ambulatory Visit: Payer: Self-pay | Admitting: Internal Medicine

## 2010-09-23 MED ORDER — ALENDRONATE SODIUM 70 MG PO TABS: 70.0000 mg | ORAL_TABLET | ORAL | Status: DC

## 2010-09-23 NOTE — Telephone Encounter (Signed)
Patient needs to schedule a CPX in order to continue with refills on medications

## 2010-09-24 NOTE — Telephone Encounter (Signed)
Has appt for CPX on 9/26 at 10:30

## 2010-11-18 ENCOUNTER — Telehealth: Payer: Self-pay | Admitting: Critical Care Medicine

## 2010-11-18 NOTE — Telephone Encounter (Signed)
Called and spoke with [facility] pharmacist regarding pt.  She states they received a transfer rx from a [facility] and wanted clarification on dosage and directions of Mycobutin which I informed her were 150mg  tabs. Directions: 4 capsules by mouth 3 x weekly, which was indicated in pt's med list.  Pharmacist stated she had called the pt as well to verify how she was taking the med.  Pt informed pharmacist that she was under the impression to take 4 tabs DAILY which she has been doing but states "sometimes she only takes 3 daily-has difficulty getting that 4th tab in during the day."  I ATC pt to verify this information with her but had to leave a message.  Will forward message to PW to address.

## 2010-11-18 NOTE — Telephone Encounter (Signed)
She needs to change back to 4 tabs only three times weekly She needs OV and bring all meds in bag.  Ok to do this with Tammy parrett for med reconcile visit

## 2010-11-18 NOTE — Telephone Encounter (Signed)
Called and spoke with pharmacist at [facility] and informed her of correct directions per PW.    LMOM for pt TCB to schedule her an appt with TP for med rec.

## 2010-11-19 NOTE — Telephone Encounter (Signed)
Spoke with the pt and advise of recs an med calender set for Thursday 11-25-10. Carron Curie, CMA

## 2010-11-25 ENCOUNTER — Encounter: Payer: Medicare Other | Admitting: Adult Health

## 2010-11-30 ENCOUNTER — Encounter: Payer: Self-pay | Admitting: Internal Medicine

## 2010-12-01 ENCOUNTER — Encounter: Payer: Self-pay | Admitting: Internal Medicine

## 2010-12-01 ENCOUNTER — Ambulatory Visit (INDEPENDENT_AMBULATORY_CARE_PROVIDER_SITE_OTHER): Payer: Medicare Other | Admitting: Internal Medicine

## 2010-12-01 VITALS — BP 118/70 | HR 161 | Temp 97.5°F | Resp 12 | Ht 63.75 in | Wt 123.6 lb

## 2010-12-01 DIAGNOSIS — J383 Other diseases of vocal cords: Secondary | ICD-10-CM

## 2010-12-01 DIAGNOSIS — R7402 Elevation of levels of lactic acid dehydrogenase (LDH): Secondary | ICD-10-CM

## 2010-12-01 DIAGNOSIS — M899 Disorder of bone, unspecified: Secondary | ICD-10-CM

## 2010-12-01 DIAGNOSIS — Z23 Encounter for immunization: Secondary | ICD-10-CM

## 2010-12-01 DIAGNOSIS — Z0000 Encounter for general adult medical examination without abnormal findings: Secondary | ICD-10-CM

## 2010-12-01 DIAGNOSIS — E785 Hyperlipidemia, unspecified: Secondary | ICD-10-CM | POA: Insufficient documentation

## 2010-12-01 DIAGNOSIS — R7401 Elevation of levels of liver transaminase levels: Secondary | ICD-10-CM

## 2010-12-01 DIAGNOSIS — M949 Disorder of cartilage, unspecified: Secondary | ICD-10-CM

## 2010-12-01 DIAGNOSIS — Z136 Encounter for screening for cardiovascular disorders: Secondary | ICD-10-CM

## 2010-12-01 LAB — VITAMIN D 25 HYDROXY (VIT D DEFICIENCY, FRACTURES): Vit D, 25-Hydroxy: 34 ng/mL (ref 30–89)

## 2010-12-01 NOTE — Progress Notes (Signed)
Subjective:    Patient ID: Heather [name], female    DOB: 1929/06/24, 77 y.o.   MRN: [id number]  HPI Medicare Wellness Visit:  The following psychosocial & medical history were reviewed as required by Medicare.   Social history: caffeine: 1 cup coffee , alcohol:  rarely ,  tobacco use : quit 1967  & exercise : occasionally.   Home & personal  safety / fall risk: no issues, activities of daily living: no limitations , seatbelt use : yes , and smoke alarm employment : yes .  Power of Attorney/Living Will status : needed  Vision ( as recorded per Nurse) & Hearing  evaluation :  Last Ophth exam 6/12:dry eyes,on Restasis. Seen @ [facility] every 8 weeks for serum based eyedrops. Whisper heard @ 6 ft Orientation :oriented X3 , memory & recall :excellent, spelling or math testing: good ,and mood & affect : normal . Depression / anxiety: denied Travel history : [place]  , immunization status :shingles & pneumovax  needed , transfusion history:  no, and preventive health surveillance ( colonoscopies, BMD , etc as per protocol/ SOC): last colonoscopy neg 2005, BMD ? Due ? Dental care:  Seen every 4-6 mos . Chart reviewed &  Updated. Active issues reviewed & addressed.       Review of Systems  She is being seen by Dr. Delford Field, pulmonologist for recurrence of Mycobacterium Avium Intracellulare pulmonary disease.     Objective:   Physical Exam Gen.: Thin but healthy and well-nourished in appearance. Alert, appropriate and cooperative throughout exam. Head: Normocephalic without obvious abnormalities Eyes: No corneal or conjunctival inflammation noted.  Extraocular motion intact. Ptosis bilaterally  Ears: External  ear exam reveals no significant lesions or deformities. Canals clear .TMs normal.  Nose: External nasal exam reveals no deformity or inflammation. Nasal mucosa are pink and moist. No lesions or exudates noted.  Mouth: Oral mucosa and oropharynx reveal no lesions or exudates. Teeth in good  repair. Mild dysphonia Neck: No deformities, masses, or tenderness noted. Range of motion normal. Thyroid small. Lungs: Normal respiratory effort; chest expands symmetrically. Lungs :decreased BS;very mild scattered rales & musical  wheezes w/o increased work of breathing. Heart: Normal rate and rhythm. Normal S1 and S2. No gallop, click, or rub. S4 w/o  murmur. Abdomen: Bowel sounds normal; abdomen soft and nontender. No masses, organomegaly or hernias noted. Genitalia: Gyn not seen   .                                                                                   Musculoskeletal/extremities: mild lordosis  noted of  the thoracic  spine. No clubbing, cyanosis, edema, noted. Range of motion  normal .Tone & strength  normal.Joints : DJD finger changes RUE > LUE . Nail health  good. Vascular: Carotid, radial artery, dorsalis pedis and  posterior tibial pulses are full and equal. No bruits present. Neurologic: Alert and oriented x3. Deep tendon reflexes symmetrical and normal.          Skin: Intact without suspicious lesions or rashes. Lymph: No cervical, axillary  lymphadenopathy present. Psych: Mood and affect are normal. Normally interactive  Assessment & Plan:  #1 Medicare Wellness Exam; criteria met ; data entered #2 Problem List reviewed ; Assessment/ Recommendations made  #3 osteopenia, followup due Plan: see Orders

## 2010-12-01 NOTE — Patient Instructions (Signed)
Recommended lifestyle interventions for Osteoporosis include calcium 600 mg twice a day  & vitamin D3 supplementation to keep vit D  level @ least 40-60. The usual vitamin D3 dose is 1000 IU daily; but individual dose is determined by annual vitamin D level monitor. Also weight bearing exercise such as  walking 30-45 minutes 3-4  X per week is recommended. Hopp   

## 2010-12-03 ENCOUNTER — Encounter: Payer: Self-pay | Admitting: Internal Medicine

## 2010-12-03 LAB — LIPID PANEL
Cholesterol: 192 mg/dL (ref 0–200)
Total CHOL/HDL Ratio: 3
Triglycerides: 44 mg/dL (ref 0.0–149.0)

## 2010-12-03 LAB — ALT: ALT: 18 U/L (ref 0–35)

## 2010-12-03 LAB — BASIC METABOLIC PANEL
CO2: 25 mEq/L (ref 19–32)
Calcium: 8.7 mg/dL (ref 8.4–10.5)
Creatinine, Ser: 0.6 mg/dL (ref 0.4–1.2)

## 2010-12-03 LAB — AST: AST: 23 U/L (ref 0–37)

## 2011-03-11 DIAGNOSIS — R498 Other voice and resonance disorders: Secondary | ICD-10-CM | POA: Diagnosis not present

## 2011-03-11 DIAGNOSIS — G252 Other specified forms of tremor: Secondary | ICD-10-CM | POA: Diagnosis not present

## 2011-03-11 DIAGNOSIS — G250 Essential tremor: Secondary | ICD-10-CM | POA: Diagnosis not present

## 2011-03-11 DIAGNOSIS — J45909 Unspecified asthma, uncomplicated: Secondary | ICD-10-CM | POA: Diagnosis not present

## 2011-03-25 DIAGNOSIS — J385 Laryngeal spasm: Secondary | ICD-10-CM | POA: Diagnosis not present

## 2011-06-24 ENCOUNTER — Other Ambulatory Visit: Payer: Self-pay | Admitting: Critical Care Medicine

## 2011-06-24 ENCOUNTER — Telehealth: Payer: Self-pay | Admitting: Critical Care Medicine

## 2011-06-24 DIAGNOSIS — A310 Pulmonary mycobacterial infection: Secondary | ICD-10-CM

## 2011-06-24 MED ORDER — AZITHROMYCIN 500 MG PO TABS: ORAL_TABLET | ORAL | Status: DC

## 2011-06-24 NOTE — Telephone Encounter (Signed)
Spoke with pt. She is requesting refill on zithromax. I have scheduled her rov with PW since overdue for followup.  Appt scheduled for 08-15-11.

## 2011-07-01 DIAGNOSIS — H04129 Dry eye syndrome of unspecified lacrimal gland: Secondary | ICD-10-CM | POA: Diagnosis not present

## 2011-08-15 ENCOUNTER — Encounter: Payer: Self-pay | Admitting: Critical Care Medicine

## 2011-08-15 ENCOUNTER — Ambulatory Visit (INDEPENDENT_AMBULATORY_CARE_PROVIDER_SITE_OTHER): Payer: Medicare Other | Admitting: Critical Care Medicine

## 2011-08-15 ENCOUNTER — Ambulatory Visit: Payer: Medicare Other | Admitting: Critical Care Medicine

## 2011-08-15 ENCOUNTER — Ambulatory Visit (INDEPENDENT_AMBULATORY_CARE_PROVIDER_SITE_OTHER)
Admission: RE | Admit: 2011-08-15 | Discharge: 2011-08-15 | Disposition: A | Discharge status: Home / Self Care | Payer: Medicare Other | Source: Ambulatory Visit | Attending: Critical Care Medicine | Admitting: Critical Care Medicine

## 2011-08-15 ENCOUNTER — Other Ambulatory Visit (INDEPENDENT_AMBULATORY_CARE_PROVIDER_SITE_OTHER): Payer: Medicare Other

## 2011-08-15 VITALS — BP 128/64 | HR 70 | Temp 98.2°F | Ht 64.0 in | Wt 120.6 lb

## 2011-08-15 DIAGNOSIS — A310 Pulmonary mycobacterial infection: Secondary | ICD-10-CM

## 2011-08-15 DIAGNOSIS — J479 Bronchiectasis, uncomplicated: Secondary | ICD-10-CM

## 2011-08-15 LAB — CBC WITH DIFFERENTIAL/PLATELET
Basophils Relative: 0.9 % (ref 0.0–3.0)
Eosinophils Absolute: 0.5 10*3/uL (ref 0.0–0.7)
Eosinophils Relative: 7.9 % — ABNORMAL HIGH (ref 0.0–5.0)
HCT: 38 % (ref 36.0–46.0)
Hemoglobin: 12.5 g/dL (ref 12.0–15.0)
MCHC: 33 g/dL (ref 30.0–36.0)
MCV: 93.1 fl (ref 78.0–100.0)
Monocytes Absolute: 0.7 10*3/uL (ref 0.1–1.0)
Neutro Abs: 3.4 10*3/uL (ref 1.4–7.7)
Neutrophils Relative %: 54.8 % (ref 43.0–77.0)
RBC: 4.08 Mil/uL (ref 3.87–5.11)
WBC: 6.2 10*3/uL (ref 4.5–10.5)

## 2011-08-15 NOTE — Progress Notes (Signed)
Subjective:    Patient ID: Heather [name], female    DOB: August 30, 1929, 78 y.o.   MRN: [id number]  HPI  08/16/2011 Hx ov MAC lung infection Rx: 2008-2009 .  D/c ABX after 2009. Then recurrent hemoptysis and worsening infiltrates and MAC ABX three times weekly Rx restarted 06/17/10. Last seen 07/2010. On ABX three times weekly.  On rifabutin/Eth/ azithromycin. No labs done.  No recent xrays   no new complaints. The patient currently is having no hemoptysis. There is no active cough or chest pain. There is no fever chills or sweats. The patient's weight is stable. Pt denies any significant sore throat, nasal congestion or excess secretions, fever, chills, sweats, unintended weight loss, pleurtic or exertional chest pain, orthopnea PND, or leg swelling Pt denies any increase in rescue therapy over baseline, denies waking up needing it or having any early am or nocturnal exacerbations of coughing/wheezing/or dyspnea. Pt also denies any obvious fluctuation in symptoms with  weather or environmental change or other alleviating or aggravating factors   Past Medical History  Diagnosis Date  . Hemoptysis   . 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 Raynaud's disease   . Fibroids uterine  . Tremor, essential   . Osteopenia   . Pulmonary infiltrates april 2008    persistent ct infiltrates  . Dry eyes     and photosensitivity  . ANA positive 2007    1:640  . Spastic dysphonia     Botox therapy, Dr Ilene Qua  . Bronchiectasis   . Hiatal hernia     presentation as chest pain     Family History  Problem Relation Age of Onset  . Heart failure Mother   . Stroke Father   . Cancer Maternal Aunt     breast     History   Social History  . Marital Status: Married    Spouse Name: N/A    Number of Children: N/A  . Years of Education: N/A   Occupational History  . Not on file.   Social History Main Topics  . Smoking status: Former Smoker -- 0.3 packs/day for 18 years    Types:  Cigarettes    Quit date: 03/07/1965  . Smokeless tobacco: Never Used   Comment: started smoking at age 54  . Alcohol Use: No  . Drug Use: No  . Sexually Active: Not on file   Other Topics Concern  . Not on file   Social History Narrative  . No narrative on file     Allergies  Allergen Reactions  . Ciprofloxacin   . Phenazopyridine Hcl     Azo caused ?     Outpatient Prescriptions Prior to Visit  Medication Sig Dispense Refill  . alendronate (FOSAMAX) 70 MG tablet Take 1 tablet (70 mg total) by mouth once a week. Take with a full glass of water on an empty stomach.  4 tablet  0  . azithromycin (ZITHROMAX) 500 MG tablet One capsule three times weekly  40 tablet  1  . Biotin 1000 MCG tablet Take 1,000 mcg by mouth daily.        . Calcium Carb-Cholecalciferol (CALCIUM 1000 + D PO) Take 1 tablet by mouth daily.        . Carboxymethylcellulose Sodium (REFRESH OP) Apply to eye. Refresh ointment at bedtime      . Cholecalciferol (VITAMIN D3) 1000 UNITS CAPS Take 1 capsule by mouth daily.        . cycloSPORINE (RESTASIS) 0.05 %  ophthalmic emulsion Place 1 drop into both eyes every 12 (twelve) hours.        Marland Kitchen ethambutol (MYAMBUTOL) 400 MG tablet Take three capsules three times weekly  40 tablet  6  . Multiple Vitamins-Minerals (MULTIVITAMIN WITH MINERALS) tablet Take 1 tablet by mouth daily.        . Omega-3 Fatty Acids (FISH OIL) 1000 MG CAPS Take 1 capsule by mouth daily.       . primidone (MYSOLINE) 250 MG tablet Take 250 mg by mouth daily.       . propranolol (INDERAL LA) 80 MG 24 hr capsule Take 80 mg by mouth daily.        . rifabutin (MYCOBUTIN) 150 MG capsule Take 4 capsules three times weekly  50 capsule  6  . Selenium 200 MCG TABS Take 1 tablet by mouth daily.            Review of Systems  Constitutional:   No  weight loss, night sweats,  Fevers, chills, fatigue, lassitude. HEENT:   No headaches,  Difficulty swallowing,  Tooth/dental problems,  Sore throat,                 No sneezing, itching, ear ache, nasal congestion, post nasal drip,   CV:  No chest pain,  Orthopnea, PND, swelling in lower extremities, anasarca, dizziness, palpitations  GI  No heartburn, indigestion, abdominal pain, nausea, vomiting, diarrhea, change in bowel habits, loss of appetite  Resp: No shortness of breath with exertion or at rest.  No excess mucus, no productive cough,  No non-productive cough,  No coughing up of blood.  No change in color of mucus.  No wheezing.  No chest wall deformity  Skin: no rash or lesions.  GU: no dysuria, change in color of urine, no urgency or frequency.  No flank pain.  MS:  No joint pain or swelling.  No decreased range of motion.  No back pain.  Psych:  No change in mood or affect. No depression or anxiety.  No memory loss.     Objective:   Physical Exam  Filed Vitals:   08/15/11 1617  BP: 128/64  Pulse: 70  Temp: 98.2 F (36.8 C)  TempSrc: Oral  Height: 5\' 4"  (1.626 m)  Weight: 120 lb 9.6 oz (54.704 kg)  SpO2: 98%    Gen: Pleasant, well-nourished, in no distress,  normal affect  ENT: No lesions,  mouth clear,  oropharynx clear, no postnasal drip  Neck: No JVD, no TMG, no carotid bruits  Lungs: No use of accessory muscles, no dullness to percussion, distant BS,  Cardiovascular: RRR, heart sounds normal, no murmur or gallops, no peripheral edema  Abdomen: soft and NT, no HSM,  BS normal  Musculoskeletal: No deformities, no cyanosis or clubbing  Neuro: alert, non focal  Skin: Warm, no lesions or rashes      Dg Chest 2 View  08/15/2011  *RADIOLOGY REPORT*  Clinical Data: History of bronchiectasis.  History of smoking.  CHEST - 2 VIEW  Comparison: Chest x-ray 05/31/2010.  Findings: Again noted are patchy areas of interstitial prominence and micro nodularity in the lungs, predominately in the region of the right upper and right middle lobes.  In these regions, there is some associated mild cylindrical bronchiectasis. Elevation  of the horizontal fissure is again noted, compatible with chronic right upper lobe volume loss.  Left lung is relatively clear, with exception of a small amount of peripheral micronodularity in the left upper lobe (  unchanged).  No acute consolidative air space disease.  No definite pleural effusions.  Pulmonary vasculature and the cardiomediastinal silhouette are within normal limits. Atherosclerotic calcifications are again noted within the arch of the aorta.  IMPRESSION: 1.  Overall, the radiographic appearance of the chest is essentially unchanged compared to prior examinations, as detailed above.  The appearance is again suggestive of a chronic indolent atypical infectious process such as Mycobacterium avium- intracellulare (MAI). 2.  Atherosclerosis.  Original Report Authenticated By: Florencia Reasons, M.D.     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 Pt tolerates this well.  Updated Medication List Outpatient Encounter Prescriptions as of 08/15/2011  Medication Sig Dispense Refill  . alendronate (FOSAMAX) 70 MG tablet Take 1 tablet (70 mg total) by mouth once a week. Take with a full glass of water on an empty stomach.  4 tablet  0  . azithromycin (ZITHROMAX) 500 MG tablet One capsule three times weekly  40 tablet  1  .  Biotin 1000 MCG tablet Take 1,000 mcg by mouth daily.        . Calcium Carb-Cholecalciferol (CALCIUM 1000 + D PO) Take 1 tablet by mouth daily.        . Carboxymethylcellulose Sodium (REFRESH OP) Apply to eye. Refresh ointment at bedtime      . Cholecalciferol (VITAMIN D3) 1000 UNITS CAPS Take 1 capsule by mouth daily.        . cycloSPORINE (RESTASIS) 0.05 % ophthalmic emulsion Place 1 drop into both eyes every 12 (twelve) hours.        Marland Kitchen ethambutol (MYAMBUTOL) 400 MG tablet Take three capsules three times weekly  40 tablet  6  . Multiple Vitamins-Minerals (MULTIVITAMIN WITH MINERALS) tablet Take 1 tablet by mouth daily.        . Omega-3 Fatty Acids (FISH OIL) 1000 MG CAPS Take 1 capsule by mouth daily.       . primidone (MYSOLINE) 250 MG tablet Take 250 mg by mouth daily.       . propranolol (INDERAL LA) 80 MG 24 hr capsule Take 80 mg by mouth daily.        . rifabutin (MYCOBUTIN) 150 MG capsule Take 4 capsules three times weekly  50 capsule  6  . Selenium 200 MCG TABS Take 1 tablet by mouth daily.

## 2011-08-15 NOTE — Patient Instructions (Signed)
Labs today Chest xray today No change in medications Return 6 months

## 2011-08-16 LAB — HEPATIC FUNCTION PANEL
Bilirubin, Direct: 0.1 mg/dL (ref 0.0–0.3)
Total Bilirubin: 0.3 mg/dL (ref 0.3–1.2)

## 2011-08-16 NOTE — Progress Notes (Signed)
Quick Note:  Call pt and tell her labs are ok, No change in medications ______ 

## 2011-08-16 NOTE — Progress Notes (Signed)
Quick Note:  Notify the patient that the Xray is stable, no new areas of infection  No change in medications are recommended. Continue current meds as prescribed at last office visit ______

## 2011-08-16 NOTE — Assessment & Plan Note (Signed)
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

## 2011-08-17 DIAGNOSIS — H04129 Dry eye syndrome of unspecified lacrimal gland: Secondary | ICD-10-CM | POA: Diagnosis not present

## 2011-08-17 NOTE — Progress Notes (Signed)
Quick Note:  Called, spoke with pt. I informed her of CXR results and recs per Dr. Wright. She verbalized understanding of this and voiced no further questions/concerns at this time. ______ 

## 2011-08-17 NOTE — Progress Notes (Signed)
Quick Note:  Called, spoke with pt. I informed her of lab results and recs per Dr. Delford Field. She verbalized understanding of this and voiced no further questions/concerns at this time. ______

## 2011-08-19 ENCOUNTER — Encounter: Payer: Self-pay | Admitting: Internal Medicine

## 2011-08-19 ENCOUNTER — Ambulatory Visit (INDEPENDENT_AMBULATORY_CARE_PROVIDER_SITE_OTHER): Payer: Medicare Other | Admitting: Internal Medicine

## 2011-08-19 VITALS — BP 110/64 | HR 64 | Temp 98.4°F | Resp 14 | Ht 64.0 in | Wt 121.0 lb

## 2011-08-19 DIAGNOSIS — S96912A Strain of unspecified muscle and tendon at ankle and foot level, left foot, initial encounter: Secondary | ICD-10-CM

## 2011-08-19 DIAGNOSIS — M79672 Pain in left foot: Secondary | ICD-10-CM

## 2011-08-19 DIAGNOSIS — M79609 Pain in unspecified limb: Secondary | ICD-10-CM | POA: Diagnosis not present

## 2011-08-19 DIAGNOSIS — S93409A Sprain of unspecified ligament of unspecified ankle, initial encounter: Secondary | ICD-10-CM | POA: Diagnosis not present

## 2011-08-19 DIAGNOSIS — T887XXA Unspecified adverse effect of drug or medicament, initial encounter: Secondary | ICD-10-CM | POA: Insufficient documentation

## 2011-08-19 MED ORDER — TRAMADOL HCL 50 MG PO TABS: 50.0000 mg | ORAL_TABLET | Freq: Four times a day (QID) | ORAL | Status: AC | PRN

## 2011-08-19 NOTE — Progress Notes (Signed)
  Subjective:    Patient ID: Heather [name], female    DOB: 1929-04-23, 78 y.o.   MRN: [id number]  HPI She turned her left ankle 08/15/11 and landed on her hip on a dog bed. She has had no hip or back discomfort but has continued to have constant, aching pain along the lateral dorsal left foot. She used a topical analgesic and cold packs with some relief. She now has pain with direct pressure on this area and with ambulation.  She has no past history of fractures but does have osteopenia.      Review of Systems   She denies any cardiac prodrome such as chest pain, palpitations, or rhythm change prior to the event. Also she had no neurologic prodrome such as, numbness/tingling, weakness, or seizure prior to the event.    Objective:   Physical Exam   She is thin but appears well-nourished. She is in no acute distress  She does have significant osteoarthritic changes greater in the right hand than the left.  Deep tendon reflexes are equal and normal. Strength and tone are good  Varicosities are noted of the ankles. There is patchy, dry erythema without definite bruising  She does have pes planus. Nail health is good.  There is visible swelling of the left lateral malleolus. There is tenderness to palpation over the lateral aspect of the left foot          Assessment & Plan:  #1 sprain left ankle with residual foot pain. Rule out fracture of the metatarsals. There may also be a significant soft tissue ankle injury based on the swelling.  Plan: Orthopedic office will be contacted to see if she can be worked in this afternoon. If not films will be made and she'll be placed in a walking shoe with pain medicine coverage

## 2011-08-19 NOTE — Patient Instructions (Addendum)
.  Share results with Orthopedic Nurse

## 2011-08-23 ENCOUNTER — Other Ambulatory Visit: Payer: Self-pay | Admitting: Critical Care Medicine

## 2011-08-25 ENCOUNTER — Telehealth: Payer: Self-pay | Admitting: Critical Care Medicine

## 2011-08-25 ENCOUNTER — Telehealth: Payer: Self-pay | Admitting: Internal Medicine

## 2011-08-25 DIAGNOSIS — A310 Pulmonary mycobacterial infection: Secondary | ICD-10-CM

## 2011-08-25 MED ORDER — AZITHROMYCIN 500 MG PO TABS: ORAL_TABLET | ORAL | Status: DC

## 2011-08-25 MED ORDER — ETHAMBUTOL HCL 400 MG PO TABS: ORAL_TABLET | ORAL | Status: DC

## 2011-08-25 NOTE — Telephone Encounter (Signed)
Ok to refill x 5 

## 2011-08-25 NOTE — Telephone Encounter (Signed)
RX's has been sent for pt and she is aware. Nothing further was needed

## 2011-08-25 NOTE — Telephone Encounter (Signed)
Pt requesting refill on ethambutol and azithromycin. Ethambutol last refilled 06/16/10 3 capsules 3 times a week #40 x 6 refills and the azithromycin last refilled 06/24/11 1 capsule 3 times a week #40 x 1 refill. Please advise if okay to refill and how many refills pt is allowed to have Dr. Delford Field thanks

## 2011-08-25 NOTE — Telephone Encounter (Signed)
Pt states she was sent to an ortho and was found to have a broken ankle and 2 cracked bones on the top of her foot. Pt states she is in a boot for the next 6-8 weeks and would like to know if she could get a temporary handicap sticker.

## 2011-08-25 NOTE — Telephone Encounter (Signed)
Absolutely; please fill out for me to sign

## 2011-08-25 NOTE — Telephone Encounter (Signed)
Spoke with patient, patient will stop by to pick up handicap sticker

## 2011-09-12 DIAGNOSIS — M79609 Pain in unspecified limb: Secondary | ICD-10-CM | POA: Diagnosis not present

## 2011-09-21 DIAGNOSIS — H04129 Dry eye syndrome of unspecified lacrimal gland: Secondary | ICD-10-CM | POA: Diagnosis not present

## 2011-11-02 ENCOUNTER — Ambulatory Visit: Payer: Medicare Other | Admitting: Internal Medicine

## 2011-11-03 ENCOUNTER — Ambulatory Visit: Payer: Medicare Other | Admitting: Internal Medicine

## 2011-11-09 DIAGNOSIS — H04129 Dry eye syndrome of unspecified lacrimal gland: Secondary | ICD-10-CM | POA: Diagnosis not present

## 2011-11-18 DIAGNOSIS — J385 Laryngeal spasm: Secondary | ICD-10-CM | POA: Diagnosis not present

## 2011-12-01 DIAGNOSIS — Z23 Encounter for immunization: Secondary | ICD-10-CM | POA: Diagnosis not present

## 2011-12-26 DIAGNOSIS — H04129 Dry eye syndrome of unspecified lacrimal gland: Secondary | ICD-10-CM | POA: Diagnosis not present

## 2012-02-13 ENCOUNTER — Telehealth: Payer: Self-pay | Admitting: Critical Care Medicine

## 2012-02-13 NOTE — Telephone Encounter (Signed)
Left Message 3x to schd follow up apt. Sent letter 02/13/12. °

## 2012-03-14 DIAGNOSIS — H169 Unspecified keratitis: Secondary | ICD-10-CM | POA: Diagnosis not present

## 2012-03-14 DIAGNOSIS — H04129 Dry eye syndrome of unspecified lacrimal gland: Secondary | ICD-10-CM | POA: Diagnosis not present

## 2012-07-02 ENCOUNTER — Other Ambulatory Visit: Payer: Self-pay | Admitting: Critical Care Medicine

## 2012-08-03 ENCOUNTER — Ambulatory Visit: Payer: Self-pay | Admitting: Nurse Practitioner

## 2012-08-22 ENCOUNTER — Other Ambulatory Visit: Payer: Self-pay

## 2012-08-22 MED ORDER — PROPRANOLOL HCL ER 80 MG PO CP24: 80.0000 mg | ORAL_CAPSULE | Freq: Every day | ORAL | Status: DC

## 2012-08-22 MED ORDER — PRIMIDONE 250 MG PO TABS: ORAL_TABLET | ORAL | Status: DC

## 2012-09-26 ENCOUNTER — Other Ambulatory Visit: Payer: Self-pay | Admitting: Critical Care Medicine

## 2012-09-27 ENCOUNTER — Other Ambulatory Visit: Payer: Self-pay | Admitting: Neurology

## 2012-09-27 NOTE — Telephone Encounter (Signed)
Pt's last OV with PW: 08/15/11; was asked to f/u in 6 months No pending OV. ATC pt at # in chart.  Went directly to [employer] stating "the person has a VM that has not yet been set up."  No option to leave msg. Will call back later to schedule OV with PW.  Then will send rx to last until OV.

## 2012-09-28 NOTE — Telephone Encounter (Signed)
ATC pt at # in chart.  Received msg that "the perosn you have tried to call has a VM box that has not yet been set up."  No option to leave msg.   Given the weekend, will send rx to pharm x 1 with instructions that pt needs OV as I have attempted to contact pt with no success.

## 2012-10-11 ENCOUNTER — Other Ambulatory Visit: Payer: Self-pay | Admitting: Critical Care Medicine

## 2012-11-07 DIAGNOSIS — H169 Unspecified keratitis: Secondary | ICD-10-CM | POA: Diagnosis not present

## 2012-11-07 DIAGNOSIS — H04129 Dry eye syndrome of unspecified lacrimal gland: Secondary | ICD-10-CM | POA: Diagnosis not present

## 2012-11-22 ENCOUNTER — Encounter: Payer: Self-pay | Admitting: Nurse Practitioner

## 2012-11-23 ENCOUNTER — Ambulatory Visit: Payer: Self-pay | Admitting: Nurse Practitioner

## 2012-11-30 ENCOUNTER — Other Ambulatory Visit: Payer: Self-pay

## 2012-11-30 MED ORDER — PROPRANOLOL HCL ER 80 MG PO CP24: 80.0000 mg | ORAL_CAPSULE | Freq: Every day | ORAL | Status: DC

## 2012-12-05 ENCOUNTER — Encounter: Payer: Self-pay | Admitting: Nurse Practitioner

## 2012-12-05 ENCOUNTER — Ambulatory Visit (INDEPENDENT_AMBULATORY_CARE_PROVIDER_SITE_OTHER): Payer: Medicare Other | Admitting: Nurse Practitioner

## 2012-12-05 VITALS — BP 116/73 | HR 68 | Temp 97.9°F | Ht 64.0 in | Wt 122.0 lb

## 2012-12-05 DIAGNOSIS — G250 Essential tremor: Secondary | ICD-10-CM | POA: Diagnosis not present

## 2012-12-05 DIAGNOSIS — Z23 Encounter for immunization: Secondary | ICD-10-CM | POA: Diagnosis not present

## 2012-12-05 DIAGNOSIS — J387 Other diseases of larynx: Secondary | ICD-10-CM

## 2012-12-05 DIAGNOSIS — J383 Other diseases of vocal cords: Secondary | ICD-10-CM

## 2012-12-05 MED ORDER — PRIMIDONE 250 MG PO TABS: ORAL_TABLET | ORAL | Status: DC

## 2012-12-05 MED ORDER — PROPRANOLOL HCL ER 80 MG PO CP24: 80.0000 mg | ORAL_CAPSULE | Freq: Every day | ORAL | Status: DC

## 2012-12-05 NOTE — Progress Notes (Signed)
[facility]  PATIENT: Heather [name] DOB: [birth year]   REASON FOR VISIT: follow up HISTORY FROM: patient  HISTORY OF PRESENT ILLNESS: 03/11/11 (CD) HPI: Patient returns today for first visit with me, she has been followed in this office since 2000, first by Dr. Noreene Filbert, then by Dr. Orlin Hilding, andfinally by Dr Thad Ranger . She has essential tremor, which is well controlled on Inderal LA 80 mg daily and primidone 250 mg b.i.d. She also has spasmodic dysphonia, 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 Karie Fetch Last visit February 2010, primidone reduced to AM . "some tremor, not much"- has reduced primidone to qam, feels she is doing just as well- no difference from morning to evening- not limited by tremor.  UPDATE 12/05/12 (LL):  Heather [name] comes in for follow up visit, it has been nearly 2 years since last visit.  She reports that her tremors are no worse, she takes 1 primidone daily and 1 Propranolol LA daily with no side effects.  She is still receiving Botox injections with Dr. Viann Shove at [facility] approx. Every 6 months and she is due for them now.  She has no complaints.  Review of Systems  Out of a complete 14 system review, the patient complains of only the following symptoms, and all other reviewed systems are negative. (none)     ALLERGIES: Allergies  Allergen Reactions  . Ciprofloxacin   . Phenazopyridine Hcl     Azo caused ?    HOME MEDICATIONS: Outpatient Prescriptions Prior to Visit  Medication Sig Dispense Refill  . alendronate (FOSAMAX) 70 MG tablet Take 1 tablet (70 mg total) by mouth once a week. Take with a full glass of water on an empty stomach.  4 tablet  0  . azithromycin (ZITHROMAX) 500 MG tablet One capsule three times weekly  40 tablet  5  . Biotin 1000 MCG tablet Take 1,000 mcg by mouth daily.        . Calcium Carb-Cholecalciferol (CALCIUM 1000 + D PO) Take 1 tablet by mouth daily.        .  Carboxymethylcellulose Sodium (REFRESH OP) Apply to eye. Refresh ointment at bedtime      . Cholecalciferol (VITAMIN D3) 1000 UNITS CAPS Take 1 capsule by mouth daily.        . cycloSPORINE (RESTASIS) 0.05 % ophthalmic emulsion Place 1 drop into both eyes every 12 (twelve) hours.        Marland Kitchen ethambutol (MYAMBUTOL) 400 MG tablet Take three capsules three times weekly  40 tablet  5  . Multiple Vitamins-Minerals (MULTIVITAMIN WITH MINERALS) tablet Take 1 tablet by mouth daily.        Marland Kitchen MYCOBUTIN 150 MG capsule TAKE 4 CAPSULES BY MOUTH 3 TIMES WEEKLY  50 capsule  0  . Omega-3 Fatty Acids (FISH OIL) 1000 MG CAPS Take 1 capsule by mouth daily.       . Selenium 200 MCG TABS Take 1 tablet by mouth daily.        . primidone (MYSOLINE) 250 MG tablet Take one tablet by mouth in the morning and take half a tablet in the evening as needed  135 tablet  0  . propranolol ER (INDERAL LA) 80 MG 24 hr capsule Take 1 capsule (80 mg total) by mouth daily.  90 capsule  0   No facility-administered medications prior to visit.    PAST MEDICAL HISTORY: Past Medical History  Diagnosis Date  . Hemoptysis   .  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 Raynaud's disease   . Fibroids uterine  . Tremor, essential   . Osteopenia   . Pulmonary infiltrates april 2008    persistent ct infiltrates  . Dry eyes     and photosensitivity  . ANA positive 2007    1:640  . Spastic dysphonia     Botox therapy, Dr Ilene Qua  . Bronchiectasis   . Hiatal hernia     presentation as chest pain    PAST SURGICAL HISTORY: Past Surgical History  Procedure Laterality Date  . Dilation and curettage, diagnostic / therapeutic    . Colonoscopy  2005    negative  . Cataract surgery      bilaterally    FAMILY HISTORY: Family History  Problem Relation Age of Onset  . Heart failure Mother   . Stroke Father   . Cancer Maternal Aunt     breast  . Osteoporosis Mother   . Osteoporosis Father     SOCIAL  HISTORY: History   Social History  . Marital Status: Married    Spouse Name: N/A    Number of Children: N/A  . Years of Education: N/A   Occupational History  . retired    Social History Main Topics  . Smoking status: Former Smoker -- 0.30 packs/day for 18 years    Types: Cigarettes    Quit date: 03/07/1965  . Smokeless tobacco: Never Used     Comment: started smoking at age 80  . Alcohol Use: No  . Drug Use: No  . Sexual Activity: Not on file   Other Topics Concern  . Not on file   Social History Narrative  . No narrative on file     PHYSICAL EXAM  Filed Vitals:   12/05/12 1139  BP: 116/73  Pulse: 68  Temp: 97.9 F (36.6 C)  TempSrc: Oral  Height: 5\' 4"  (1.626 m)  Weight: 122 lb (55.339 kg)   Body mass index is 20.93 kg/(m^2).  Physical Exam  General: well developed, well nourished, no acute distress Ears, Nose and Throat: mallompati 1 Neck: supple  Cardiovascular: good peripheral pulses  Skin: no bruising , no edema  Neurologic Exam  Mental Status: Awake, alert, in no distress.  Speech is non -fluent  due to tremor , "shaking voice" and dysphonic . Cranial Nerves: Extraocular movements full without nystagmus.  uvula is slightly deviated , weaker on the left. no tremor at tongue,  no nystagmus.  Face, tongue, palate move normally and symmetrically. Mild 6Hz  vertical head tremor. Motor: Normal strength on limited testing in all extremities. Normal tome. Mild B 6Hz  kinetic/ postural tremor. Sensory: intact to light touch.  Coordination: Rapid movements normal in upper extremities.  Finger-to-nose and heel-to-shin normal. Gait and Station: Normal.  Able to toe and tandem walk without difficulty. Romberg negative.   DIAGNOSTIC DATA (LABS, IMAGING, TESTING) - I reviewed patient records, labs, notes, testing and imaging myself where available.   ASSESSMENT AND PLAN Heather [name] is an 79 year old right-handed Caucasian female with history of essential  tremor and spastic dysphonia, followed by Dr. Viann Shove at [facility], and pulmonary disease, all stable.  1) Essential tremor and spasmodic dysphonia.  2) responding to myseline and propanolol, continue both medications, refills sent. 3) Follow up in 1 year, sooner as needed.  Meds ordered this encounter  Medications  . primidone (MYSOLINE) 250 MG tablet    Sig: Take one tablet by mouth in the morning  and take half a tablet in the evening as needed    Dispense:  135 tablet    Refill:  3    Order Specific Question:  Supervising Provider    Answer:  DOHMEIER, CARMEN [[id number]]  . propranolol ER (INDERAL LA) 80 MG 24 hr capsule    Sig: Take 1 capsule (80 mg total) by mouth daily.    Dispense:  90 capsule    Refill:  3    Order Specific Question:  Supervising Provider    Answer:  Vickey Huger, CARMEN [[id number]]    Ronal Fear, MSN, NP-C 12/05/2012, 12:31 PM [facility] [address] [place], [place] [address] [phone]

## 2012-12-05 NOTE — Patient Instructions (Signed)
Continue current medications.  Refills were sent to [facility], [address].  Follow up visit in 1 year, sooner as needed.

## 2012-12-23 ENCOUNTER — Encounter: Payer: Self-pay | Admitting: Family Medicine

## 2012-12-23 ENCOUNTER — Ambulatory Visit (INDEPENDENT_AMBULATORY_CARE_PROVIDER_SITE_OTHER): Payer: Medicare Other | Admitting: Family Medicine

## 2012-12-23 ENCOUNTER — Ambulatory Visit: Payer: Medicare Other

## 2012-12-23 VITALS — BP 140/80 | HR 80 | Temp 98.2°F | Resp 16 | Ht 64.0 in | Wt 117.4 lb

## 2012-12-23 DIAGNOSIS — S20219A Contusion of unspecified front wall of thorax, initial encounter: Secondary | ICD-10-CM

## 2012-12-23 DIAGNOSIS — R0781 Pleurodynia: Secondary | ICD-10-CM

## 2012-12-23 DIAGNOSIS — S8000XA Contusion of unspecified knee, initial encounter: Secondary | ICD-10-CM

## 2012-12-23 DIAGNOSIS — M25561 Pain in right knee: Secondary | ICD-10-CM

## 2012-12-23 DIAGNOSIS — S20212A Contusion of left front wall of thorax, initial encounter: Secondary | ICD-10-CM

## 2012-12-23 DIAGNOSIS — S8001XA Contusion of right knee, initial encounter: Secondary | ICD-10-CM

## 2012-12-23 DIAGNOSIS — R079 Chest pain, unspecified: Secondary | ICD-10-CM

## 2012-12-23 NOTE — Progress Notes (Signed)
This chart was scribed for Heather Chick, MD by Caryn Bee, Medical Scribe. This patient was seen in Room1 and the patient's care was started at 6:17 PM.  Subjective:    Patient ID: Heather [name], female    DOB: Oct 08, 1929, 79 y.o.   MRN: [id number]  HPI HPI Comments: Heather [name] is a 79 y.o. female who presents to [facility] complaining of constant right knee pain onset after tripping falling on 12/16/12. Pt states that she fell flat on cement. She reports that the knee did not swell after the incident. Pt denies previous injury to the knee. Pt now ambulates with a walker, but she usually doesn't need one at baseline. Pt has used ice as treatment with mild relief. She reports that she is not having any trouble sleeping. She denies taking any medication for pain. She also complains of associated left flank pain onset after the incident. Pt denies dizziness. She denies hematuria, n/v, abdominal pain.   Review of Systems  Genitourinary: Positive for flank pain (Left flank). Negative for dysuria and hematuria.  Musculoskeletal: Positive for arthralgias (right knee pain) and myalgias. Negative for joint swelling, neck pain and neck stiffness.  Neurological: Negative for dizziness, weakness, numbness and headaches.   Past Medical History  Diagnosis Date  . Hemoptysis   . 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 Raynaud's disease   . Fibroids uterine  . Tremor, essential   . Osteopenia   . Pulmonary infiltrates april 2008    persistent ct infiltrates  . Dry eyes     and photosensitivity  . ANA positive 2007    1:640  . Spastic dysphonia     Botox therapy, Dr Ilene Qua  . Bronchiectasis   . Hiatal hernia     presentation as chest pain   History   Social History  . Marital Status: Married    Spouse Name: N/A    Number of Children: N/A  . Years of Education: N/A   Occupational History  . retired    Social History Main Topics  . Smoking status: Former Smoker -- 0.30  packs/day for 18 years    Types: Cigarettes    Quit date: 03/07/1965  . Smokeless tobacco: Never Used     Comment: started smoking at age 27  . Alcohol Use: No  . Drug Use: No  . Sexual Activity: Not on file   Other Topics Concern  . Not on file   Social History Narrative  . No narrative on file   Past Surgical History  Procedure Laterality Date  . Dilation and curettage, diagnostic / therapeutic    . Colonoscopy  2005    negative  . Cataract surgery      bilaterally   Family History  Problem Relation Age of Onset  . Heart failure Mother   . Stroke Father   . Cancer Maternal Aunt     breast  . Osteoporosis Mother   . Osteoporosis Father    Allergies  Allergen Reactions  . Ciprofloxacin   . Phenazopyridine Hcl     Azo caused ?       Objective:   Physical Exam  Nursing note and vitals reviewed. Constitutional: She is oriented to person, place, and time. She appears well-developed and well-nourished. No distress.  HENT:  Head: Normocephalic and atraumatic.  Eyes: Conjunctivae are normal. Pupils are equal, round, and reactive to light.  Neck: Normal range of motion. Neck supple.  Cardiovascular: Normal rate, regular rhythm  and normal heart sounds.  Exam reveals no gallop and no friction rub.   No murmur heard. Pulmonary/Chest: Effort normal and breath sounds normal. No respiratory distress. She has no wheezes. She has no rales. She exhibits no tenderness.  Musculoskeletal: She exhibits tenderness. She exhibits no edema.       Right shoulder: Normal.       Left shoulder: Normal.       Right hip: Normal.       Right knee: She exhibits ecchymosis. She exhibits normal range of motion, no swelling, no effusion, no deformity, no laceration, no erythema, normal alignment and normal meniscus. Tenderness found. Medial joint line and lateral joint line tenderness noted. No patellar tendon tenderness noted.       Right ankle: Normal.       Cervical back: Normal.        Thoracic back: She exhibits decreased range of motion, tenderness, bony tenderness, pain and spasm.       Lumbar back: She exhibits tenderness and pain. She exhibits normal range of motion, no bony tenderness and no spasm.  No edema to right knee. Slight bruising right knee.   Neurological: She is alert and oriented to person, place, and time. She displays normal reflexes. No cranial nerve deficit. She exhibits normal muscle tone. Coordination normal.  Skin: Skin is warm and dry. She is not diaphoretic.   [facility] reading (PRIMARY) by  Dr. Katrinka Blazing.  R KNEE: NAD; L RIBS: NAD; +MULTIPLE COMPRESSION FRACTURES THORACIC SPINE; RUL INFILTRATE/DENSITY.      Assessment & Plan:  The primary encounter diagnosis was Knee pain, acute, right. Diagnoses of Rib pain on left side, Contusion, knee, right, initial encounter, and Contusion of ribs, left, initial encounter were also pertinent to this visit.  1. R knee pain/contusion: New. Recommend rest, ice, elevation. If no improvement in two weeks, call for ortho referral. 2. L rib pain/contusion:  New. Recommend rest, ice, Tylenol for pain. 3. Fall: New.  With multiple injuries; continue using cane for stability.  No orders of the defined types were placed in this encounter.   I personally performed the services described in this documentation, which was scribed in my presence.  The recorded information has been reviewed and is accurate.  Nilda Simmer, M.D.  [facility] [address] [place], [place]  [address] [phone] phone [phone] fax

## 2012-12-24 NOTE — Progress Notes (Signed)
I reviewed note and agree with plan.   Annielee Jemmott R. Onya Eutsler, MD 12/24/2012, 11:04 PM Certified in Neurology, Neurophysiology and Neuroimaging  [facility] [address], [address] [place], [place] [address] [phone]  

## 2013-07-31 ENCOUNTER — Encounter: Payer: Self-pay | Admitting: Internal Medicine

## 2013-09-10 ENCOUNTER — Inpatient Hospital Stay (HOSPITAL_COMMUNITY)
Admission: EM | Admit: 2013-09-10 | Discharge: 2013-09-13 | DRG: 542 | Disposition: A | Discharge status: Skilled Nursing Facility | Payer: Medicare Other | Attending: Internal Medicine | Admitting: Internal Medicine

## 2013-09-10 ENCOUNTER — Emergency Department (HOSPITAL_COMMUNITY): Payer: Medicare Other

## 2013-09-10 ENCOUNTER — Telehealth: Payer: Self-pay | Admitting: *Deleted

## 2013-09-10 ENCOUNTER — Encounter (HOSPITAL_COMMUNITY): Payer: Self-pay | Admitting: Emergency Medicine

## 2013-09-10 DIAGNOSIS — IMO0001 Reserved for inherently not codable concepts without codable children: Secondary | ICD-10-CM | POA: Diagnosis not present

## 2013-09-10 DIAGNOSIS — R5383 Other fatigue: Secondary | ICD-10-CM

## 2013-09-10 DIAGNOSIS — Z8262 Family history of osteoporosis: Secondary | ICD-10-CM

## 2013-09-10 DIAGNOSIS — R5381 Other malaise: Secondary | ICD-10-CM

## 2013-09-10 DIAGNOSIS — J383 Other diseases of vocal cords: Secondary | ICD-10-CM

## 2013-09-10 DIAGNOSIS — M48061 Spinal stenosis, lumbar region without neurogenic claudication: Secondary | ICD-10-CM | POA: Diagnosis present

## 2013-09-10 DIAGNOSIS — Z681 Body mass index (BMI) 19 or less, adult: Secondary | ICD-10-CM

## 2013-09-10 DIAGNOSIS — W08XXXA Fall from other furniture, initial encounter: Secondary | ICD-10-CM | POA: Diagnosis present

## 2013-09-10 DIAGNOSIS — S3210XA Unspecified fracture of sacrum, initial encounter for closed fracture: Secondary | ICD-10-CM | POA: Diagnosis not present

## 2013-09-10 DIAGNOSIS — R490 Dysphonia: Secondary | ICD-10-CM | POA: Diagnosis present

## 2013-09-10 DIAGNOSIS — Z87891 Personal history of nicotine dependence: Secondary | ICD-10-CM

## 2013-09-10 DIAGNOSIS — I7300 Raynaud's syndrome without gangrene: Secondary | ICD-10-CM | POA: Diagnosis present

## 2013-09-10 DIAGNOSIS — R627 Adult failure to thrive: Secondary | ICD-10-CM | POA: Diagnosis present

## 2013-09-10 DIAGNOSIS — M25559 Pain in unspecified hip: Secondary | ICD-10-CM | POA: Diagnosis not present

## 2013-09-10 DIAGNOSIS — R531 Weakness: Secondary | ICD-10-CM

## 2013-09-10 DIAGNOSIS — G252 Other specified forms of tremor: Secondary | ICD-10-CM | POA: Diagnosis not present

## 2013-09-10 DIAGNOSIS — G250 Essential tremor: Secondary | ICD-10-CM | POA: Diagnosis present

## 2013-09-10 DIAGNOSIS — M5126 Other intervertebral disc displacement, lumbar region: Secondary | ICD-10-CM | POA: Diagnosis not present

## 2013-09-10 DIAGNOSIS — M8448XA Pathological fracture, other site, initial encounter for fracture: Principal | ICD-10-CM | POA: Diagnosis present

## 2013-09-10 DIAGNOSIS — A4901 Methicillin susceptible Staphylococcus aureus infection, unspecified site: Secondary | ICD-10-CM | POA: Diagnosis not present

## 2013-09-10 DIAGNOSIS — Z823 Family history of stroke: Secondary | ICD-10-CM | POA: Diagnosis not present

## 2013-09-10 DIAGNOSIS — E43 Unspecified severe protein-calorie malnutrition: Secondary | ICD-10-CM | POA: Insufficient documentation

## 2013-09-10 DIAGNOSIS — M949 Disorder of cartilage, unspecified: Secondary | ICD-10-CM

## 2013-09-10 DIAGNOSIS — A310 Pulmonary mycobacterial infection: Secondary | ICD-10-CM

## 2013-09-10 DIAGNOSIS — IMO0002 Reserved for concepts with insufficient information to code with codable children: Secondary | ICD-10-CM | POA: Diagnosis not present

## 2013-09-10 DIAGNOSIS — E860 Dehydration: Secondary | ICD-10-CM | POA: Diagnosis present

## 2013-09-10 DIAGNOSIS — N390 Urinary tract infection, site not specified: Secondary | ICD-10-CM | POA: Diagnosis present

## 2013-09-10 DIAGNOSIS — M899 Disorder of bone, unspecified: Secondary | ICD-10-CM

## 2013-09-10 DIAGNOSIS — M5430 Sciatica, unspecified side: Secondary | ICD-10-CM | POA: Diagnosis not present

## 2013-09-10 DIAGNOSIS — M6281 Muscle weakness (generalized): Secondary | ICD-10-CM | POA: Diagnosis not present

## 2013-09-10 DIAGNOSIS — Z9181 History of falling: Secondary | ICD-10-CM

## 2013-09-10 DIAGNOSIS — E785 Hyperlipidemia, unspecified: Secondary | ICD-10-CM

## 2013-09-10 DIAGNOSIS — Z79899 Other long term (current) drug therapy: Secondary | ICD-10-CM | POA: Diagnosis not present

## 2013-09-10 DIAGNOSIS — D259 Leiomyoma of uterus, unspecified: Secondary | ICD-10-CM

## 2013-09-10 DIAGNOSIS — S322XXA Fracture of coccyx, initial encounter for closed fracture: Secondary | ICD-10-CM | POA: Diagnosis not present

## 2013-09-10 DIAGNOSIS — R404 Transient alteration of awareness: Secondary | ICD-10-CM | POA: Diagnosis not present

## 2013-09-10 DIAGNOSIS — M5432 Sciatica, left side: Secondary | ICD-10-CM

## 2013-09-10 DIAGNOSIS — T887XXA Unspecified adverse effect of drug or medicament, initial encounter: Secondary | ICD-10-CM

## 2013-09-10 DIAGNOSIS — M545 Low back pain, unspecified: Secondary | ICD-10-CM | POA: Diagnosis not present

## 2013-09-10 DIAGNOSIS — Z5189 Encounter for other specified aftercare: Secondary | ICD-10-CM | POA: Diagnosis not present

## 2013-09-10 LAB — BASIC METABOLIC PANEL
Anion gap: 16 — ABNORMAL HIGH (ref 5–15)
BUN: 22 mg/dL (ref 6–23)
CALCIUM: 9.2 mg/dL (ref 8.4–10.5)
CO2: 21 meq/L (ref 19–32)
CREATININE: 0.69 mg/dL (ref 0.50–1.10)
Chloride: 98 mEq/L (ref 96–112)
GFR calc Af Amer: >90 mL/min (ref >90)
GFR calc non Af Amer: 79 mL/min — ABNORMAL LOW (ref >90)
Glucose, Bld: 103 mg/dL — ABNORMAL HIGH (ref 70–99)
Potassium: 3.8 mEq/L (ref 3.7–5.3)
SODIUM: 135 meq/L — AB (ref 137–147)

## 2013-09-10 LAB — URINALYSIS, ROUTINE W REFLEX MICROSCOPIC
BILIRUBIN URINE: NEGATIVE
GLUCOSE, UA: NEGATIVE mg/dL
HGB URINE DIPSTICK: NEGATIVE
Ketones, ur: NEGATIVE mg/dL
Nitrite: NEGATIVE
PH: 6 (ref 5.0–8.0)
PROTEIN: NEGATIVE mg/dL
Specific Gravity, Urine: 1.017 (ref 1.005–1.030)
Urobilinogen, UA: 0.2 mg/dL (ref 0.0–1.0)

## 2013-09-10 LAB — CBC WITH DIFFERENTIAL/PLATELET
Basophils Absolute: 0 10*3/uL (ref 0.0–0.1)
Basophils Relative: 0 % (ref 0–1)
EOS ABS: 0.1 10*3/uL (ref 0.0–0.7)
Eosinophils Relative: 1 % (ref 0–5)
HEMATOCRIT: 39.4 % (ref 36.0–46.0)
Hemoglobin: 12.6 g/dL (ref 12.0–15.0)
LYMPHS ABS: 0.8 10*3/uL (ref 0.7–4.0)
Lymphocytes Relative: 12 % (ref 12–46)
MCH: 26.7 pg (ref 26.0–34.0)
MCHC: 32 g/dL (ref 30.0–36.0)
MCV: 83.5 fL (ref 78.0–100.0)
MONO ABS: 0.7 10*3/uL (ref 0.1–1.0)
Monocytes Relative: 10 % (ref 3–12)
Neutro Abs: 5.6 10*3/uL (ref 1.7–7.7)
Neutrophils Relative %: 77 % (ref 43–77)
PLATELETS: 349 10*3/uL (ref 150–400)
RBC: 4.72 MIL/uL (ref 3.87–5.11)
RDW: 13.6 % (ref 11.5–15.5)
WBC: 7.2 10*3/uL (ref 4.0–10.5)

## 2013-09-10 LAB — URINE MICROSCOPIC-ADD ON

## 2013-09-10 MED ORDER — ACETAMINOPHEN 650 MG RE SUPP: 650.0000 mg | Freq: Four times a day (QID) | RECTAL | Status: DC | PRN

## 2013-09-10 MED ORDER — ENOXAPARIN SODIUM 40 MG/0.4ML ~~LOC~~ SOLN
40.0000 mg | SUBCUTANEOUS | Status: DC
  Administered 2013-09-10 – 2013-09-12 (×3): 40 mg via SUBCUTANEOUS
  Filled 2013-09-10 (×4): qty 0.4

## 2013-09-10 MED ORDER — POLYVINYL ALCOHOL 1.4 % OP SOLN
1.0000 [drp] | OPHTHALMIC | Status: DC | PRN
  Filled 2013-09-10: qty 15

## 2013-09-10 MED ORDER — SODIUM CHLORIDE 0.9 % IV BOLUS (SEPSIS)
500.0000 mL | Freq: Once | INTRAVENOUS | Status: AC
  Administered 2013-09-10: 500 mL via INTRAVENOUS

## 2013-09-10 MED ORDER — DEXTROSE 5 % IV SOLN
1.0000 g | INTRAVENOUS | Status: DC
  Administered 2013-09-11 – 2013-09-12 (×2): 1 g via INTRAVENOUS
  Filled 2013-09-10 (×2): qty 10

## 2013-09-10 MED ORDER — SODIUM CHLORIDE 0.9 % IV SOLN
INTRAVENOUS | Status: DC
  Administered 2013-09-10 – 2013-09-13 (×4): via INTRAVENOUS

## 2013-09-10 MED ORDER — ONDANSETRON HCL 4 MG/2ML IJ SOLN
4.0000 mg | Freq: Four times a day (QID) | INTRAMUSCULAR | Status: DC | PRN
Start: 2013-09-10 — End: 2013-09-13

## 2013-09-10 MED ORDER — ONDANSETRON HCL 4 MG PO TABS
4.0000 mg | ORAL_TABLET | Freq: Four times a day (QID) | ORAL | Status: DC | PRN
Start: 2013-09-10 — End: 2013-09-13

## 2013-09-10 MED ORDER — ACETAMINOPHEN 325 MG PO TABS: 650.0000 mg | ORAL_TABLET | Freq: Four times a day (QID) | ORAL | Status: DC | PRN

## 2013-09-10 MED ORDER — CYCLOSPORINE 0.05 % OP EMUL
1.0000 [drp] | Freq: Two times a day (BID) | OPHTHALMIC | Status: DC
  Administered 2013-09-10 – 2013-09-13 (×6): 1 [drp] via OPHTHALMIC
  Filled 2013-09-10 (×7): qty 1

## 2013-09-10 MED ORDER — HYDROCODONE-ACETAMINOPHEN 5-325 MG PO TABS
1.0000 | ORAL_TABLET | Freq: Once | ORAL | Status: AC
  Administered 2013-09-10: 1 via ORAL
  Filled 2013-09-10: qty 1

## 2013-09-10 MED ORDER — DIAZEPAM 2 MG PO TABS
2.0000 mg | ORAL_TABLET | Freq: Once | ORAL | Status: AC
  Administered 2013-09-10: 2 mg via ORAL
  Filled 2013-09-10: qty 1

## 2013-09-10 MED ORDER — DEXTROSE 5 % IV SOLN
1.0000 g | Freq: Once | INTRAVENOUS | Status: AC
  Administered 2013-09-10: 1 g via INTRAVENOUS
  Filled 2013-09-10: qty 10

## 2013-09-10 MED ORDER — OXYCODONE HCL 5 MG PO TABS
5.0000 mg | ORAL_TABLET | ORAL | Status: DC | PRN
  Administered 2013-09-11 – 2013-09-13 (×7): 5 mg via ORAL
  Filled 2013-09-10 (×7): qty 1

## 2013-09-10 MED ORDER — HYPROMELLOSE (GONIOSCOPIC) 2.5 % OP SOLN: 2.0000 [drp] | Freq: Four times a day (QID) | OPHTHALMIC | Status: DC | PRN

## 2013-09-10 NOTE — ED Provider Notes (Addendum)
TIME SEEN: 8:58 AM  CHIEF COMPLAINT: Left hip and buttock pain  HPI: Patient is a 80 y.o. F with history of bronchiectasis, Raynaud's presents to the emergency department with complaints of buttock pain for the past 2 days. She states that she thinks that she may have gone out of bed wrong and turned the wrong way causing her pain in her left buttock. She states she also has some numbness of her foot and calf but no saddle anesthesia. No bowel or bladder incontinence. No urinary retention. No fever. No night sweats or unexplained weight loss. No recent back surgery or epidural injections. She did have a fall off of her couch 2 days ago where she slid off the couch onto the floor and had to call EMS to help get her off the floor. She did not hit her head at that time. She is not on anticoagulation. No other injury. No back pain. She states she has had sciatica of the right side and this feels exactly the same. Patient is normally able to ambulate without any assistive devices. She lives at home alignment. She has a nephew who lives in [place] but rarely visits her.   ROS: See HPI Constitutional: no fever  Eyes: no drainage  ENT: no runny nose   Cardiovascular:  no chest pain  Resp: no SOB  GI: no vomiting GU: no dysuria Integumentary: no rash  Allergy: no hives  Musculoskeletal: no leg swelling  Neurological: no slurred speech ROS otherwise negative  PAST MEDICAL HISTORY/PAST SURGICAL HISTORY:  Past Medical History  Diagnosis Date  . Hemoptysis   . 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 Raynaud's disease   . Fibroids uterine  . Tremor, essential   . Osteopenia   . Pulmonary infiltrates april 2008    persistent ct infiltrates  . Dry eyes     and photosensitivity  . ANA positive 2007    1:640  . Spastic dysphonia     Botox therapy, Dr Okey Regal  . Bronchiectasis   . Hiatal hernia     presentation as chest pain    MEDICATIONS:  Prior to Admission medications    Medication Sig Start Date End Date Taking? Authorizing Provider  alendronate (FOSAMAX) 70 MG tablet Take 1 tablet (70 mg total) by mouth once a week. Take with a full glass of water on an empty stomach. 09/23/10   Hendricks Limes, MD  azithromycin (ZITHROMAX) 500 MG tablet One capsule three times weekly 08/25/11   Elsie Stain, MD  azithromycin (ZITHROMAX) 500 MG tablet TAKE 1 TABLET BY MOUTH THREE TIMES WEEKLY 07/02/12   Elsie Stain, MD  azithromycin [facility]) 500 MG tablet Take one tablet by mouth three times a week 10/11/12   Elsie Stain, MD  Biotin 1000 MCG tablet Take 1,000 mcg by mouth daily.      Historical Provider, MD  Calcium Carb-Cholecalciferol (CALCIUM 1000 + D PO) Take 1 tablet by mouth daily.      Historical Provider, MD  Carboxymethylcellulose Sodium (REFRESH OP) Apply to eye. Refresh ointment at bedtime    Historical Provider, MD  Cholecalciferol (VITAMIN D3) 1000 UNITS CAPS Take 1 capsule by mouth daily.      Historical Provider, MD  cycloSPORINE (RESTASIS) 0.05 % ophthalmic emulsion Place 1 drop into both eyes every 12 (twelve) hours.      Historical Provider, MD  ethambutol (MYAMBUTOL) 400 MG tablet Take three capsules three times weekly 08/25/11   Elsie Stain,  MD  Multiple Vitamins-Minerals (MULTIVITAMIN WITH MINERALS) tablet Take 1 tablet by mouth daily.      Historical Provider, MD  MYCOBUTIN 150 MG capsule TAKE 4 CAPSULES BY MOUTH 3 TIMES WEEKLY 09/26/12   Elsie Stain, MD  Omega-3 Fatty Acids (FISH OIL) 1000 MG CAPS Take 1 capsule by mouth daily.     Historical Provider, MD  primidone (MYSOLINE) 250 MG tablet Take one tablet by mouth in the morning and take half a tablet in the evening as needed 12/05/12   Philmore Pali, NP  propranolol ER (INDERAL LA) 80 MG 24 hr capsule Take 1 capsule (80 mg total) by mouth daily. 12/05/12   Philmore Pali, NP  Selenium 200 MCG TABS Take 1 tablet by mouth daily.      Historical Provider, MD    ALLERGIES:  Allergies   Allergen Reactions  . Ciprofloxacin   . Phenazopyridine Hcl     Azo caused ?    SOCIAL HISTORY:  History  Substance Use Topics  . Smoking status: Former Smoker -- 0.30 packs/day for 18 years    Types: Cigarettes    Quit date: 03/07/1965  . Smokeless tobacco: Never Used     Comment: started smoking at age 34  . Alcohol Use: No    FAMILY HISTORY: Family History  Problem Relation Age of Onset  . Heart failure Mother   . Stroke Father   . Cancer Maternal Aunt     breast  . Osteoporosis Mother   . Osteoporosis Father     EXAM: BP 115/66  Pulse 103  Temp(Src) 97.5 F (36.4 C) (Oral)  Resp 18  SpO2 94% CONSTITUTIONAL: Alert and oriented and responds appropriately to questions. Well-appearing; well-nourished HEAD: Normocephalic EYES: Conjunctivae clear, PERRL ENT: normal nose; no rhinorrhea; moist mucous membranes; pharynx without lesions noted NECK: Supple, no meningismus, no LAD  CARD: RRR; S1 and S2 appreciated; no murmurs, no clicks, no rubs, no gallops RESP: Normal chest excursion without splinting or tachypnea; breath sounds clear and equal bilaterally; no wheezes, no rhonchi, no rales,  ABD/GI: Normal bowel sounds; non-distended; soft, non-tender, no rebound, no guarding BACK:  The back appears normal and is non-tender to palpation, there is no CVA tenderness, no midline spinal tenderness or step-off or deformity PELVIS:  Stable and nontender to palpation EXT: Patient is tender to palpation over the left buttock region with no lesions, no leg length discrepancy, 2+ DP pulses bilaterally, sensation slightly diminished in her L4/5 distribution of her left lower extremity, Normal ROM in all joints; non-tender to palpation; no edema; normal capillary refill; no cyanosis    SKIN: Normal color for age and race; warm NEURO: Moves all extremities equally, 2+ bilateral patellar and Achilles deep tendon reflexes, no clonus, sensation to light touch is slightly diminished in the  L4/5 distribution of her left lower extremity; strength 5/5 in all 4 extremities PSYCH: The patient's mood and manner are appropriate. Grooming and personal hygiene are appropriate.  MEDICAL DECISION MAKING: Pt here with likely left-sided sciatica. She does have some numbness in the L4/5 distribution but is otherwise neurologically intact with normal strength and reflexes. She has no midline spinal tenderness. No red flag symptoms. Given she did have a fall off her couch 2 days ago and is elderly and might have osteoporosis, will obtain an x-ray of her left hip although I suspect it will be negative. We'll give pain medication and reassess.   ED PROGRESS: 10:45 PM  Pt's x-ray is  negative for any acute injury. She states she's feeling much better after Vicodin but is still unable to ambulate. We will give more pain medication, Valium and reassess.   Patient is only able to ambulate 10 steps with physical therapy. The notes state that they do not feel she is safe to be discharged home and recommended rehabilitation, skilled nursing home placement. Would have constipation she agrees to this plan. We'll obtain labs, urine to evaluate for no other organic cause for her symptoms of weakness.   Patient's labs are unremarkable. She does appear to have a UTI with large leukocytes, few bacteria but also a few squamous cells. Culture pending. Will give IV ceftriaxone.   Case management and social work have seen the patient. She is now stating that she would like discharge home if possible and does not want nursing home placement. I feel patient may have some level of delirium as she has moments where she is lucid and then does not remember [place] even getting her out of bed to walk 3 separate times. She is currently oriented x3. She is still unable to even get out of bed and walk secondary to pain in her left buttock. Dr. Coralyn Pear with hospitalist service will see the patient. Will admit for observation, MedSurg for  pain control for sciatica. She does appear to have a mild UTI with mild delirium.     [place], DO 09/10/13 [place], DO 09/10/13 1542

## 2013-09-10 NOTE — H&P (Signed)
[facility] History and Physical  Heather [name] MEQ:[id number] DOB: 1930/02/13 DOA: 09/10/2013  Referring physician:  PCP: Unice Cobble, MD   Chief Complaint: Generalized weakness, lower back pain/sciatica  HPI: Heather [name] is a 80 y.o. female with a past medical history of spastic dysphonia, DJD, essential tremor, brought to emergency department by EMS with complaints of weakness and lower back pain. She presently resides home alone, having several episodes of "sliding off the couch" of the past several days, and today felt too weak to ambulate. She complains of left buttock pain which radiates down her leg, thinks she may have turned "the wrong way" getting out of bed a few days ago.  Otherwise she denies shortness of breath, dizziness, lightheadedness, chest pain, fevers, chills, nausea, vomiting, dysuria or hematuria. In the ER initial labs unremarkable, although ua did show evidence of UTI.                                                                                                                                                                                   Review of Systems:  Constitutional:  No weight loss, night sweats, Fevers, chills, fatigue.  HEENT:  No headaches, Difficulty swallowing,Tooth/dental problems,Sore throat,  No sneezing, itching, ear ache, nasal congestion, post nasal drip,  Cardio-vascular:  No chest pain, Orthopnea, PND, swelling in lower extremities, anasarca, dizziness, palpitations  GI:  No heartburn, indigestion, abdominal pain, nausea, vomiting, diarrhea, change in bowel habits, loss of appetite  Resp:  No shortness of breath with exertion or at rest. No excess mucus, no productive cough, No non-productive cough, No coughing up of blood.No change in color of mucus.No wheezing.No chest wall deformity  Skin:  no rash or lesions.  GU:  no dysuria, change in color of urine, no urgency or frequency. No flank pain.  Musculoskeletal:  No joint pain or  swelling. No decreased range of motion. Positive for left buttock pain Psych:  No change in mood or affect. No depression or anxiety. No memory loss.   Past Medical History  Diagnosis Date  . Hemoptysis   . 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 Raynaud's disease   . Fibroids uterine  . Tremor, essential   . Osteopenia   . Pulmonary infiltrates april 2008    persistent ct infiltrates  . Dry eyes     and photosensitivity  . ANA positive 2007    1:640  . Spastic dysphonia     Botox therapy, Dr Okey Regal  . Bronchiectasis   . Hiatal hernia     presentation as chest pain   Past Surgical History  Procedure Laterality Date  . Dilation and curettage, diagnostic /  therapeutic    . Colonoscopy  2005    negative  . Cataract surgery      bilaterally   Social History:  reports that she quit smoking about 48 years ago. Her smoking use included Cigarettes. She has a 5.4 pack-year smoking history. She has never used smokeless tobacco. She reports that she does not drink alcohol or use illicit drugs.  Allergies  Allergen Reactions  . Ciprofloxacin Other (See Comments)    Dizzy   . Phenazopyridine Hcl Other (See Comments)    Unknown - Azo caused ?    Family History  Problem Relation Age of Onset  . Heart failure Mother   . Stroke Father   . Cancer Maternal Aunt     breast  . Osteoporosis Mother   . Osteoporosis Father      Prior to Admission medications   Medication Sig Start Date End Date Taking? Authorizing Provider  cycloSPORINE (RESTASIS) 0.05 % ophthalmic emulsion Place 1 drop into both eyes every 12 (twelve) hours.    Yes Historical Provider, MD  hydroxypropyl methylcellulose (ISOPTO TEARS) 2.5 % ophthalmic solution Place 2 drops into both eyes 4 (four) times daily as needed for dry eyes.   Yes Historical Provider, MD  Multiple Vitamins-Minerals (MULTIVITAMIN WITH MINERALS) tablet Take 1 tablet by mouth daily.     Yes Historical Provider, MD  naproxen sodium  (ANAPROX) 220 MG tablet Take 220 mg by mouth 2 (two) times daily as needed (pain).    Yes Historical Provider, MD  primidone (MYSOLINE) 250 MG tablet Take 250 mg by mouth every morning.   Yes Historical Provider, MD  propranolol ER (INDERAL LA) 80 MG 24 hr capsule Take 80 mg by mouth every morning.   Yes Historical Provider, MD   Physical Exam: Filed Vitals:   09/10/13 1500  BP: 114/65  Pulse: 79  Temp:   Resp:     BP 114/65  Pulse 79  Temp(Src) 97.8 F (36.6 C) (Oral)  Resp 18  SpO2 100%  General:  Appears calm and comfortable, in no acute distress, appropriate and following commands, clinically appears dehydrated Eyes: PERRL, normal lids, irises & conjunctiva ENT: grossly normal hearing, lips & tongue, dry oral mucosa.  Neck: no LAD, masses or thyromegaly Cardiovascular: RRR, no m/r/g. No LE edema. Telemetry: SR, no arrhythmias  Respiratory: CTA bilaterally, no w/r/r. Normal respiratory effort. Abdomen: soft, ntnd Skin: no rash or induration seen on limited exam Musculoskeletal: grossly normal tone BUE/BLE Psychiatric: grossly normal mood and affect, speech fluent and appropriate Neurologic: grossly non-focal.          Labs on Admission:  Basic Metabolic Panel:  Recent Labs Lab 09/10/13 1253  NA 135*  K 3.8  CL 98  CO2 21  GLUCOSE 103*  BUN 22  CREATININE 0.69  CALCIUM 9.2   Liver Function Tests: No results found for this basename: AST, ALT, ALKPHOS, BILITOT, PROT, ALBUMIN,  in the last 168 hours No results found for this basename: LIPASE, AMYLASE,  in the last 168 hours No results found for this basename: AMMONIA,  in the last 168 hours CBC:  Recent Labs Lab 09/10/13 1253  WBC 7.2  NEUTROABS 5.6  HGB 12.6  HCT 39.4  MCV 83.5  PLT 349   Cardiac Enzymes: No results found for this basename: CKTOTAL, CKMB, CKMBINDEX, TROPONINI,  in the last 168 hours  BNP (last 3 results) No results found for this basename: PROBNP,  in the last 8760 hours CBG: No  results  found for this basename: GLUCAP,  in the last 168 hours  Radiological Exams on Admission: Dg Hip Complete Left  09/10/2013   CLINICAL DATA:  Left hip pain with history of sciatica and recent fall  EXAM: LEFT HIP - COMPLETE 2+ VIEW  COMPARISON:  Left hip correction bilateral hip series of February 16, 2006  FINDINGS: The bony pelvis is osteopenic. There is no acute fracture. The observed portions of the sacrum are normal. The hip joint spaces are reasonably well-maintained for age. AP and lateral views of the left hip reveal no acute fracture nor other bony abnormality.  IMPRESSION: There is no acute bony abnormality of the pelvis or left hip.   Electronically Signed   By: David  Martinique   On: 09/10/2013 09:51    EKG: Independently reviewed.   Assessment/Plan Principal Problem:   FTT (failure to thrive) in adult Active Problems:   UTI (urinary tract infection)   Sciatica   1. Failure to thrive. Patient is an 80 year old female, appearing to have several falls in her home with a past week with associated weakness. She had a difficult time ambulating in the emergency room. Likely related to dehydration and underlying urinary tract infection. Will admit her to the inpatient service and treat with IV fluids and IV antibiotic therapy. Consult Physical Therapy.  2. Urinary Tract Infection. May be a contributor to patient's weakness and recurrent falls. Will start Rocephin 1 gram IV q daily, follow up on urine cultures.  3. Sciatica. Provide as needed oral narcotic analgesia for pain management, consult physical therapy.  4. Dehydration. She appears dry on exam, I suspect has had minimal PO intake over the last several days as she resides alone and probably has not been able to ambulate well. Providing Iv fluids.   5. DVT Prophylaxis. Lovenox   Code Status: I discussed code status, she would like CPR to be attempted Family Communication: Not present Disposition Plan: Admit to inpatient  service, anticipate may need more than 2 nights in the hospital  Time spent: 70 min  Kelvin Cellar [facility] Pager [phone]  **Disclaimer: This note may have been dictated with voice recognition software. Similar sounding words can inadvertently be transcribed and this note may contain transcription errors which may not have been corrected upon publication of note.**

## 2013-09-10 NOTE — ED Notes (Signed)
Per EMS- Patient slid approx. 10 inches from her couch 2 days ago. Patient landed on 18 inches of newspaper. Patient laid on the newspaper x 2 days. Patient got to a phone and called EMS today. Patient c/o left hip pain.

## 2013-09-10 NOTE — ED Notes (Signed)
Patient was unable to ambulate. Barely could stand getting off the bed. She was in too much pain

## 2013-09-10 NOTE — Telephone Encounter (Signed)
Pt left msg on triage stating she want md to rx her something for pain really need a muscle relaxant. Called pt back inform her before md can rx something she need to be seen. Last ov 2013 with Dr. Linna Darner. Pt states she is at the Er now because the pain is too bad. Inform pt to call and make Er follow-up once she been discharge...Johny Chess

## 2013-09-10 NOTE — Progress Notes (Addendum)
CARE MANAGEMENT ED NOTE 09/10/2013  Patient:  Heather [name], Heather [name]   Account Number:  [id number]  Date Initiated:  09/10/2013  Documentation initiated by:  Jackelyn Poling  Subjective/Objective Assessment:   80 yr old medicare female [place] pt slide approx. 10 in from her couch 2 days ago. Pt landed on 26 in of newspaper. Pt laid on the newspaper x 2 days. Pt got to a phone & called EMS today. Pt c/o left hip pain     Subjective/Objective Assessment Detail:   X-ray indicates no fractures  Pt seen by [facility] PT after CM saw pt Pt recommended SNF placement per EDP after pt only able to tolerate walking 10 feet with rolling walker  Pt states she has a "gentle" female rotweiler at home that emt assisted with feeding before she was brought to ED    Pt states she lives alone except her dog with support from a nephew in [place], a female & female neighbor/friends and her niece in [place] Her cell phone needed charging Cm assisted her with this so she could call her niece in [place] She let her know  Pt choice for [facility] & DME [facility] ED CM spoke with Cyril Mourning of [facility] at 1217 to provide verbal referral for HHRN/PT/rolator     Action/Plan:   ED Cm received consult from ED [place], Amber and Dr Leonides Schanz for possible home health services.  CM spoke with pt about services Choice offered to / List presented to pt CM reviewed in details medicare guidelines, home health [facility]) (length of stay   Action/Plan Detail:   in home, types of [facility] staff available, coverage, primary caregiver, up to 24 hrs before services may be started) ED Cm consulted ED SW after spoke with EDP, ward after pt seen by PT   Anticipated DC Date:       Status Recommendation to Physician:   Result of Recommendation:    Other ED Services  Consult Working Plan   In-house referral  Clinical Social Worker   DC Forensic scientist  Other  Outpatient Services - Pt will follow up    Choice offered to /  List presented to:    DME arranged  Vassie Moselle     DME agency  [place] arranged  HH-1 RN  [place].    Status of service:  Completed, signed off  ED Comments:   ED Comments Detail:   09/10/13 [place] care notified of pt admission Pt to be followed while in hospital 1558 ED Cm spoke with Dr Coralyn Pear about recommendations. Attending MD reviewing pt Dr Coralyn Pear states pt informed him she wanted discharge  1535 ED Cm spoke with EDP, ward about admision who states pt informed her she would go to a facility 1523 ED spoke with medical director about admission Recommendations offered  1515 ED CM spoke with attending MD, Coralyn Pear about admission  1507 ED CM and ED SW visited pt to provided private duty nursing service options,  facility options and to attempt to establish further support systems baseline.  Pt noted lying in bed on right side with head on a pillow on side rails. Cm & SW offered to help her straighten up in bed but she refused Stating she had a warm blanket to her back.  Informed pt that home health & private duty  nursing are 2 different services.  Discussed the differences. Discussed [facility] PT recommendations for 24 hr care or snf placement. Pt informed CM & SW that she prefers d/c home versus a facility. SW inquired if pt wanted assistance with finding telephone numbers for her female friend (cuts her lawn "since I have been living there") , her nephew, nieces or cousin in [place] but she refused the assistance while holding her cell phone in her hand.  Stated "I like my privacy", "They all are busy" Discussed nephew caring for his teen age daughter "who is so gorgeous"  Pt had informed niece (lives in [place]) who has called since ED admission that she was going to a "baby shower" if she was d/c on 09/11/13. Pt states that at her baseline she still drives. Reported her lawn service man had visited  her in last 1-2 days and "called last night"  Pt states she prefers the rolator to a wheelchair because she does not have room in her house for the wheel chair (when CM offered further DME)  1425 ED CM reviewed results of pt's CBC, BMP and UA CM discussed pt again with ED SW 1319 Pt states she is also covered by her husband (living in [place]) Reports they are still married but does not live together.  Pt unable to find [employer] card in purse so that Cm could provided a copy to ED registration. Cm spoke with ED registration staff to attempt to run cigna coverage but unable to do so without the spouse's name.  Pt can be given a form from registration & number to call to have cigna applied to bill at a later time. Pt reluctant to give Cm other family/emergency contact numbers to let someone know she is in ED Pt has her own cell phone

## 2013-09-10 NOTE — ED Notes (Signed)
Bed: WA10 Expected date:  Expected time:  Means of arrival:  Comments: EMS fall 

## 2013-09-10 NOTE — ED Notes (Signed)
[facility] management called and said that the hospitalist wanted to examine the patient in the ED.

## 2013-09-10 NOTE — Progress Notes (Signed)
CSW met with patient to assess. Patient is hopeful to return home. She states that she lives alone and has nearby family but doesn't "want to bother them because they are all so busy". CSW and CM discussed various rehab options with patient. Due to primary health insurance of Medicare, patient will not qualify for SNF rehab services without a qualifying inpatient stay. CSW will continue to follow for discharge planning as needed.   Tilden Fossa, MSW, [facility] Clinical Social Worker [employer] [phone]

## 2013-09-10 NOTE — Evaluation (Signed)
Physical Therapy Evaluation Patient Details Name: Heather [name] MRN: [id number] DOB: 1929-11-07 Today's Date: 09/10/2013   History of Present Illness  80 yo female admitted with L LE pain(buttock/hip down to foot), back pain. Pt lives alone  Clinical Impression  On eval, pt required Mod assist for mobility-only able to tolerate ~10 feet of ambulation with rolling walker. Pt rates pain as 6/10 with activity. Mobility is significantly limited by pain at time of eval. At this time, recommend ST rehab at [facility]. Pt lives alone and would not be able to safely and efficiently manage at home without assistance.     Follow Up Recommendations SNF;Supervision/Assistance - 24 hour    Equipment Recommendations  Rolling walker with 5" wheels    Recommendations for Other Services OT consult     Precautions / Restrictions Precautions Precautions: Fall Restrictions Weight Bearing Restrictions: No      Mobility  Bed Mobility Overal bed mobility: Needs Assistance Bed Mobility: Supine to Sit;Sit to Supine     Supine to sit: Mod assist;HOB elevated Sit to supine: Mod assist;HOB elevated   General bed mobility comments: assist for trunk and bil LEs. Increased time.   Transfers Overall transfer level: Needs assistance Equipment used: Rolling walker (2 wheeled) Transfers: Sit to/from Stand Sit to Stand: Min assist         General transfer comment: assist to rise, stabilize, control descent. VCs safety, technique, hand placement  Ambulation/Gait Ambulation/Gait assistance: Min assist Ambulation Distance (Feet): 10 Feet Assistive device: Rolling walker (2 wheeled) Gait Pattern/deviations: Step-to pattern;Decreased stride length;Antalgic;Trunk flexed;Decreased weight shift to left     General Gait Details: Pt able to weightbear but reports pain as 6/10. distance significantly limited by pain. assist to stabilize/support pt throughout ambulation.   Stairs            Wheelchair  Mobility    Modified Rankin (Stroke Patients Only)       Balance Overall balance assessment: Needs assistance;History of Falls         Standing balance support: Bilateral upper extremity supported;During functional activity Standing balance-Leahy Scale: Poor                               Pertinent Vitals/Pain 6/10 back, L LE(hip/buttocks to foot) with activity. Repositioned back on stretcher.     Home Living Family/patient expects to be discharged to:: Private residence Living Arrangements: Alone   Type of Home: House Home Access: Level entry     Home Layout: One level Home Equipment: None      Prior Function Level of Independence: Independent               Hand Dominance        Extremity/Trunk Assessment   Upper Extremity Assessment: Overall WFL for tasks assessed           Lower Extremity Assessment: LLE deficits/detail   LLE Deficits / Details: ROM limited by pain  Cervical / Trunk Assessment: Normal  Communication   Communication: No difficulties  Cognition Arousal/Alertness: Awake/alert Behavior During Therapy: WFL for tasks assessed/performed Overall Cognitive Status: Within Functional Limits for tasks assessed                      General Comments      Exercises        Assessment/Plan    PT Assessment Patient needs continued PT services  PT Diagnosis Difficulty walking;Abnormality of gait;Acute  pain;Generalized weakness   PT Problem List Decreased range of motion;Decreased activity tolerance;Decreased balance;Decreased mobility;Pain;Decreased knowledge of use of DME;Decreased strength  PT Treatment Interventions DME instruction;Gait training;Functional mobility training;Therapeutic activities;Therapeutic exercise;Balance training;Patient/family education   PT Goals (Current goals can be found in the Care Plan section) Acute Rehab PT Goals Patient Stated Goal: less pain.  PT Goal Formulation: With  patient Time For Goal Achievement: 09/24/13 Potential to Achieve Goals: Good    Frequency Min 3X/week   Barriers to discharge        Co-evaluation               End of Session Equipment Utilized During Treatment: Gait belt Activity Tolerance: Patient limited by pain Patient left: in bed;with call bell/phone within reach;with nursing/sitter in room      Functional Assessment Tool Used: clinical judgement Functional Limitation: Mobility: Walking and moving around Mobility: Walking and Moving Around Current Status (K8003): At least 20 percent but less than 40 percent impaired, limited or restricted Mobility: Walking and Moving Around Goal Status [phone]): At least 1 percent but less than 20 percent impaired, limited or restricted    Time: 1201-1213 PT Time Calculation (min): 12 min   Charges:   PT Evaluation $Initial PT Evaluation Tier I: 1 Procedure PT Treatments $Gait Training: 8-22 mins   PT G Codes:   Functional Assessment Tool Used: clinical judgement Functional Limitation: Mobility: Walking and moving around    [place], MPT Pager: [phone]

## 2013-09-11 ENCOUNTER — Inpatient Hospital Stay (HOSPITAL_COMMUNITY): Payer: Medicare Other

## 2013-09-11 DIAGNOSIS — IMO0002 Reserved for concepts with insufficient information to code with codable children: Secondary | ICD-10-CM | POA: Diagnosis not present

## 2013-09-11 DIAGNOSIS — S3210XA Unspecified fracture of sacrum, initial encounter for closed fracture: Secondary | ICD-10-CM | POA: Diagnosis not present

## 2013-09-11 DIAGNOSIS — M545 Low back pain: Secondary | ICD-10-CM | POA: Diagnosis not present

## 2013-09-11 DIAGNOSIS — M5430 Sciatica, unspecified side: Secondary | ICD-10-CM | POA: Diagnosis not present

## 2013-09-11 LAB — BASIC METABOLIC PANEL
ANION GAP: 11 (ref 5–15)
BUN: 17 mg/dL (ref 6–23)
CHLORIDE: 106 meq/L (ref 96–112)
CO2: 23 mEq/L (ref 19–32)
Calcium: 8.6 mg/dL (ref 8.4–10.5)
Creatinine, Ser: 0.59 mg/dL (ref 0.50–1.10)
GFR calc Af Amer: >90 mL/min (ref >90)
GFR calc non Af Amer: 83 mL/min — ABNORMAL LOW (ref >90)
Glucose, Bld: 81 mg/dL (ref 70–99)
POTASSIUM: 3.8 meq/L (ref 3.7–5.3)
Sodium: 140 mEq/L (ref 137–147)

## 2013-09-11 LAB — IRON AND TIBC
Iron: 29 ug/dL — ABNORMAL LOW (ref 42–135)
SATURATION RATIOS: 16 % — AB (ref 20–55)
TIBC: 181 ug/dL — AB (ref 250–470)
UIBC: 152 ug/dL (ref 125–400)

## 2013-09-11 LAB — CBC
HEMATOCRIT: 34.7 % — AB (ref 36.0–46.0)
Hemoglobin: 11 g/dL — ABNORMAL LOW (ref 12.0–15.0)
MCH: 27 pg (ref 26.0–34.0)
MCHC: 31.7 g/dL (ref 30.0–36.0)
MCV: 85 fL (ref 78.0–100.0)
Platelets: 311 10*3/uL (ref 150–400)
RBC: 4.08 MIL/uL (ref 3.87–5.11)
RDW: 14 % (ref 11.5–15.5)
WBC: 7 10*3/uL (ref 4.0–10.5)

## 2013-09-11 LAB — RETICULOCYTES
RBC.: 4.42 MIL/uL (ref 3.87–5.11)
RETIC CT PCT: 1 % (ref 0.4–3.1)
Retic Count, Absolute: 44.2 10*3/uL (ref 19.0–186.0)

## 2013-09-11 LAB — VITAMIN B12: Vitamin B-12: 1266 pg/mL — ABNORMAL HIGH (ref 211–911)

## 2013-09-11 LAB — FOLATE: Folate: 10.1 ng/mL

## 2013-09-11 LAB — TSH: TSH: 1.79 u[IU]/mL (ref 0.350–4.500)

## 2013-09-11 LAB — FERRITIN: FERRITIN: 230 ng/mL (ref 10–291)

## 2013-09-11 MED ORDER — ENSURE COMPLETE PO LIQD
237.0000 mL | Freq: Two times a day (BID) | ORAL | Status: DC
  Administered 2013-09-11 – 2013-09-13 (×4): 237 mL via ORAL

## 2013-09-11 MED ORDER — LORAZEPAM 1 MG PO TABS
1.0000 mg | ORAL_TABLET | Freq: Once | ORAL | Status: AC
  Administered 2013-09-11: 1 mg via ORAL
  Filled 2013-09-11: qty 1

## 2013-09-11 NOTE — Progress Notes (Signed)
Clinical Social Work Department BRIEF PSYCHOSOCIAL ASSESSMENT 09/11/2013  Patient:  Heather [name], Heather [name]     Account Number:  [id number]     Admit date:  09/10/2013  Clinical Social Worker:  Earlie Server  Date/Time:  09/11/2013 11:00 AM  Referred by:  Physician  Date Referred:  09/11/2013 Referred for  SNF Placement   Other Referral:   Interview type:  Patient Other interview type:    PSYCHOSOCIAL DATA Living Status:  ALONE Admitted from facility:   Level of care:   Primary support name:  Katie Primary support relationship to patient:  FAMILY Degree of support available:   Adequate    CURRENT CONCERNS Current Concerns  Post-Acute Placement   Other Concerns:    SOCIAL WORK ASSESSMENT / PLAN CSW received referral in order to complete psychosocial assessment. CSW reviewed chart and met with patient at bedside. CSW introduced myself and explained role.    Patient reports that she lives at home alone and family does not live nearby. Patient reports that she has fallen/slid off the couch a few times at home but that she has [employer] that comes to pick her up. Patient has worked with PT and is aware of SNF recommendation but is unsure if she will go to SNF. Patient reports she wants to see how she progresses during hospital stay. CSW encouraged patient to allow CSW to complete SNF search in case placement is needed. CSW provided SNF list and explained Medicare benefits for SNF and patient agreeable to [facility] search.    CSW completed FL2 and faxed out. CSW will follow up with bed offers.   Assessment/plan status:  Psychosocial Support/Ongoing Assessment of Needs Other assessment/ plan:   Information/referral to community resources:   SNF information    PATIENT'S/FAMILY'S RESPONSE TO PLAN OF CARE: Patient alert and oriented. Patient reports she is very independent and would miss her dog if she had to go to SNF. Patient reports she is aware of SNF needs and that medical staff  is concerned about her safety. Patient agreeable to consider options and reports limited social support if she returns home. Patient thanked CSW for time and agreeable for continued follow up.       [place], [place] [phone]

## 2013-09-11 NOTE — Progress Notes (Signed)
Physical Therapy Treatment Patient Details Name: AYRIS CARANO MRN: [id number] DOB: 16-Nov-1929 Today's Date: 09/11/2013    History of Present Illness 80 yo female admitted with L LE pain(buttock/hip down to foot), back pain. Pt lives alone    PT Comments    Attempted OOB/ambulation this session. Pt unable to stand/ambulate due to severe pain. Sat EOB for a few minutes then requested to return to supine.   Follow Up Recommendations  SNF     Equipment Recommendations  Rolling walker with 5" wheels    Recommendations for Other Services OT consult     Precautions / Restrictions Precautions Precautions: Fall;Back Precaution Comments: for safety, ease of mobility as able Restrictions Weight Bearing Restrictions: No    Mobility  Bed Mobility Overal bed mobility: Needs Assistance Bed Mobility: Rolling;Sidelying to Sit;Sit to Supine Rolling: Min assist Sidelying to sit: Mod assist   Sit to supine: Mod assist   General bed mobility comments: assist for trunk and bil LEs. Increased time.   Transfers                 General transfer comment: Pt unable to attempt standing this session-pt too severe. Pt requested to return to supine.   Ambulation/Gait                 Stairs            Wheelchair Mobility    Modified Rankin (Stroke Patients Only)       Balance                                    Cognition Arousal/Alertness: Awake/alert Behavior During Therapy: WFL for tasks assessed/performed Overall Cognitive Status: Within Functional Limits for tasks assessed                      Exercises      General Comments        Pertinent Vitals/Pain 6/10 back, L LE at rest; 9/10 back, L LE with activity    Home Living Family/patient expects to be discharged to:: Unsure               Additional Comments: lives alone and drives; stands in tub; standard    Prior Function Level of Independence: Independent           PT Goals (current goals can now be found in the care plan section) Acute Rehab PT Goals Patient Stated Goal: get back to being independent Progress towards PT goals: Not progressing toward goals - comment (due to pain with mobility attempts)    Frequency  Min 3X/week    PT Plan Current plan remains appropriate    Co-evaluation             End of Session   Activity Tolerance: Patient limited by pain Patient left: in bed;with call bell/phone within reach     Time: 1047-1102 PT Time Calculation (min): 15 min  Charges:  $Therapeutic Activity: 8-22 mins                    G Codes:      Weston Anna, MPT Pager: [phone]

## 2013-09-11 NOTE — Progress Notes (Signed)
[facility] PROGRESS NOTE  Heather [name] NUU:[id number] DOB: 10-13-1929 DOA: 09/10/2013 PCP: Unice Cobble, MD  Assessment/Plan: Principal Problem:   FTT (failure to thrive) in adult Active Problems:   UTI (urinary tract infection)   Sciatica   Recurrent falls Patient had evidence of severe spinal stenosis on MRI lumbar spine 12/07,Severe spinal stenosis at L4-5 due to 10 mm of slip We'll repeat MRI of the back and this could be contribution to her falls PT/OT consultation  Urinary tract infection Continue Rocephin IV Await urine culture  essential tremor, which is well controlled on Inderal LA 80 mg daily and primidone 250 mg b.i.d Followed by [facility]   spasmodic dysphonia, 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 Huel Coventry    Code Status: full Family Communication: family updated about patient's clinical progress Disposition Plan:  Anticipate discharge 2-3 days   Brief narrative: 80 y.o. female with a past medical history of spastic dysphonia, DJD, essential tremor, brought to emergency department by EMS with complaints of weakness and lower back pain. She presently resides home alone, having several episodes of "sliding off the couch" of the past several days, and today felt too weak to ambulate. She complains of left buttock pain which radiates down her leg, thinks she may have turned "the wrong way" getting out of bed a few days ago. Otherwise she denies shortness of breath, dizziness, lightheadedness, chest pain, fevers, chills, nausea, vomiting, dysuria or hematuria. In the ER initial labs unremarkable, although ua did show evidence of UTI.    Consultants:  1 none  Procedures:  None  Antibiotics:  None  HPI/Subjective: Complaining of left hip pain and left calf pain. The patient also has numbness in her left feet  Objective: Filed Vitals:   09/10/13 1500 09/10/13 1615 09/10/13 2154  09/11/13 0554  BP: 114/65 137/70 154/77 135/73  Pulse: 79 80 85 83  Temp:  97.5 F (36.4 C) 98 F (36.7 C) 98.4 F (36.9 C)  TempSrc:  Oral Oral Oral  Resp:  18 17 18   Height:  5\' 4"  (1.626 m)    Weight:  46.9 kg (103 lb 6.3 oz)    SpO2: 100%  95% 95%    Intake/Output Summary (Last 24 hours) at 09/11/13 1123 Last data filed at 09/11/13 1052  Gross per 24 hour  Intake   1833 ml  Output      0 ml  Net   1833 ml    Exam:  HENT:  Head: Atraumatic.  Nose: Nose normal.  Mouth/Throat: Oropharynx is clear and moist.  Eyes: Conjunctivae are normal. Pupils are equal, round, and reactive to light. No scleral icterus.  Neck: Neck supple. No tracheal deviation present.  Cardiovascular: Normal rate, regular rhythm, normal heart sounds and intact distal pulses.  Pulmonary/Chest: Effort normal and breath sounds normal. No respiratory distress.  Abdominal: Soft. Normal appearance and bowel sounds are normal. She exhibits no distension. There is no tenderness.  Musculoskeletal: She exhibits no edema and no tenderness.  Neurological: She is alert. No cranial nerve deficit.    Data Reviewed: Basic Metabolic Panel:  Recent Labs Lab 09/10/13 1253 09/11/13 0511  NA 135* 140  K 3.8 3.8  CL 98 106  CO2 21 23  GLUCOSE 103* 81  BUN 22 17  CREATININE 0.69 0.59  CALCIUM 9.2 8.6    Liver Function Tests: No results found for this basename: AST, ALT, ALKPHOS, BILITOT, PROT, ALBUMIN,  in the last  168 hours No results found for this basename: LIPASE, AMYLASE,  in the last 168 hours No results found for this basename: AMMONIA,  in the last 168 hours  CBC:  Recent Labs Lab 09/10/13 1253 09/11/13 0511  WBC 7.2 7.0  NEUTROABS 5.6  --   HGB 12.6 11.0*  HCT 39.4 34.7*  MCV 83.5 85.0  PLT 349 311    Cardiac Enzymes: No results found for this basename: CKTOTAL, CKMB, CKMBINDEX, TROPONINI,  in the last 168 hours BNP (last 3 results) No results found for this basename: PROBNP,  in the  last 8760 hours   CBG: No results found for this basename: GLUCAP,  in the last 168 hours  Recent Results (from the past 240 hour(s))  CULTURE, BLOOD (ROUTINE X 2)     Status: None   Collection Time    09/10/13  4:35 PM      Result Value Ref Range Status   Specimen Description BLOOD RIGHT HAND   Final   Special Requests     Final   Value: BOTTLES DRAWN AEROBIC AND ANAEROBIC 10CC BLUE, 5 CC RED   Culture  Setup Time     Final   Value: 09/10/2013 19:07     Performed at [facility]   Culture     Final   Value:        BLOOD CULTURE RECEIVED NO GROWTH TO DATE CULTURE WILL BE HELD FOR 5 DAYS BEFORE ISSUING A FINAL NEGATIVE REPORT     Performed at [facility]   Report Status PENDING   Incomplete  CULTURE, BLOOD (ROUTINE X 2)     Status: None   Collection Time    09/10/13  4:49 PM      Result Value Ref Range Status   Specimen Description BLOOD LEFT ARM   Final   Special Requests     Final   Value: BOTTLES DRAWN AEROBIC AND ANAEROBIC 10 CC BLUE, 6 CC RED   Culture  Setup Time     Final   Value: 09/10/2013 19:07     Performed at [facility]   Culture     Final   Value:        BLOOD CULTURE RECEIVED NO GROWTH TO DATE CULTURE WILL BE HELD FOR 5 DAYS BEFORE ISSUING A FINAL NEGATIVE REPORT     Performed at [facility]   Report Status PENDING   Incomplete     Studies: Dg Hip Complete Left  09/10/2013   CLINICAL DATA:  Left hip pain with history of sciatica and recent fall  EXAM: LEFT HIP - COMPLETE 2+ VIEW  COMPARISON:  Left hip correction bilateral hip series of February 16, 2006  FINDINGS: The bony pelvis is osteopenic. There is no acute fracture. The observed portions of the sacrum are normal. The hip joint spaces are reasonably well-maintained for age. AP and lateral views of the left hip reveal no acute fracture nor other bony abnormality.  IMPRESSION: There is no acute bony abnormality of the pelvis or left hip.   Electronically Signed   By: David   [place]   On: 09/10/2013 09:51    Scheduled Meds: . cefTRIAXone (ROCEPHIN)  IV  1 g Intravenous Q24H  . cycloSPORINE  1 drop Both Eyes Q12H  . enoxaparin (LOVENOX) injection  40 mg Subcutaneous Q24H  . LORazepam  1 mg Oral Once   Continuous Infusions: . sodium chloride 75 mL/hr at 09/11/13 0034    Principal Problem:  FTT (failure to thrive) in adult Active Problems:   UTI (urinary tract infection)   Sciatica    Time spent: 40 minutes   [facility] Pager [phone]. If 8PM-8AM, please contact night-coverage at [email or web address], password [facility] 09/11/2013, 11:23 AM  LOS: 1 day

## 2013-09-11 NOTE — Progress Notes (Addendum)
Clinical Social Work Department CLINICAL SOCIAL WORK PLACEMENT NOTE 09/11/2013  Patient:  Heather [name], Heather [name]  Account Number:  [id number] Admit date:  09/10/2013  Clinical Social Worker:  Sindy Messing, LCSW  Date/time:  09/11/2013 11:00 AM  Clinical Social Work is seeking post-discharge placement for this patient at the following level of care:   [place]   (*CSW will update this form in Epic as items are completed)   09/11/2013  Patient/family provided with [facility] Department of Clinical Social Work's list of facilities offering this level of care within the geographic area requested by the patient (or if unable, by the patient's family).  09/11/2013  Patient/family informed of their freedom to choose among providers that offer the needed level of care, that participate in Medicare, Medicaid or managed care program needed by the patient, have an available bed and are willing to accept the patient.  09/11/2013  Patient/family informed of [facility]' ownership interest in [facility], as well as of the fact that they are under no obligation to receive care at this facility.  PASARR submitted to EDS on 09/11/2013 PASARR number received on 09/11/2013  FL2 transmitted to all facilities in geographic area requested by pt/family on  09/11/2013 FL2 transmitted to all facilities within larger geographic area on   Patient informed that his/her managed care company has contracts with or will negotiate with  certain facilities, including the following:     Patient/family informed of bed offers received:  09/12/13 Patient chooses bed at [facility] Physician recommends and patient chooses bed at    Patient to be transferred to [facility] on  09/13/13 Patient to be transferred to facility by [facility] Patient and family notified of transfer on 09/13/13 Name of family member notified:  Katie-great-niece  The following physician request were entered in Epic:   Additional Comments:

## 2013-09-11 NOTE — Evaluation (Signed)
Occupational Therapy Evaluation Patient Details Name: Heather [name] MRN: [id number] DOB: 1929/03/20 Today's Date: 09/11/2013    History of Present Illness 80 yo female admitted with L LE pain(buttock/hip down to foot), back pain. Pt lives alone   Clinical Impression   Pt was admitted for the above.  At time of OT eval, she was limited by pain and bed level eval was completed.  Pt will need STSNF for rehab.  Pt was independent prior to admission and drove:  Goals are set a min guard to min A level in acute.      Follow Up Recommendations  SNF    Equipment Recommendations   (to be further assessed, possibly 3:1)    Recommendations for Other Services       Precautions / Restrictions Precautions Precautions: Fall;Back Precaution Comments: for safety, ease of mobility as able Restrictions Weight Bearing Restrictions: No      Mobility Bed Mobility Overal bed mobility: Needs Assistance Bed Mobility: Rolling;Sidelying to Sit;Sit to Supine Rolling: Min assist       General bed mobility comments: assist for trunk and bil LEs. Increased time.   Transfers                     Balance                                            ADL Overall ADL's : Needs assistance/impaired     Grooming: Bed level;Set up   Upper Body Bathing: Set up;Bed level   Lower Body Bathing: Moderate assistance;Sit to/from stand   Upper Body Dressing : Minimal assistance;Bed level   Lower Body Dressing: Maximal assistance;Bed level       Toileting- Clothing Manipulation and Hygiene: Moderate assistance;Bed level         General ADL Comments: Pt attempted to get to eob but pain too great; requested pain medication.  Eval performed from bed level:  used bedpan     Vision                     Perception     Praxis      Pertinent Vitals/Pain 5-6 without moving:  L hip; increased with bed mobility.  repositioned     Hand Dominance     Extremity/Trunk  Assessment Upper Extremity Assessment Upper Extremity Assessment: Overall WFL for tasks assessed           Communication Communication Communication: No difficulties   Cognition Arousal/Alertness: Awake/alert Behavior During Therapy: WFL for tasks assessed/performed Overall Cognitive Status: Within Functional Limits for tasks assessed                     General Comments       Exercises       Shoulder Instructions      Home Living Family/patient expects to be discharged to:: Unsure                                 Additional Comments: lives alone and drives; stands in tub; standard      Prior Functioning/Environment Level of Independence: Independent             OT Diagnosis: Acute pain;Generalized weakness   OT Problem List: Decreased strength;Decreased activity tolerance;Pain;Decreased knowledge of use of  DME or AE (balance NT)   OT Treatment/Interventions: Self-care/ADL training;DME and/or AE instruction;Patient/family education;Therapeutic activities    OT Goals(Current goals can be found in the care plan section) Acute Rehab OT Goals Patient Stated Goal: get back to being independent OT Goal Formulation: With patient Time For Goal Achievement: 09/25/13 Potential to Achieve Goals: Good ADL Goals Pt Will Perform Grooming: with min guard assist;standing Pt Will Perform Lower Body Bathing: with min assist;with adaptive equipment;sit to/from stand Pt Will Perform Lower Body Dressing: with min assist;with adaptive equipment;sit to/from stand Pt Will Transfer to Toilet: with min assist;bedside commode;stand pivot transfer Pt Will Perform Toileting - Clothing Manipulation and hygiene: with min guard assist;sit to/from stand  OT Frequency: Min 2X/week   Barriers to D/C:            Co-evaluation              End of Session    Activity Tolerance: Patient limited by pain Patient left: in bed RN notified that pt still on bedpan  when I left   Time: 1157-2620 OT Time Calculation (min): 36 min (a lot of time on bedpan) Charges:  OT General Charges $OT Visit: 1 Procedure OT Evaluation $Initial OT Evaluation Tier I: 1 Procedure OT Treatments $Self Care/Home Management : 8-22 mins G-Codes:    Thirza Pellicano 09-30-2013, 12:29 PM  Lesle Chris, OTR/L [phone] 2013-09-30

## 2013-09-11 NOTE — Care Management Note (Signed)
    Page 1 of 1   09/13/2013     1:58:03 PM CARE MANAGEMENT NOTE 09/13/2013  Patient:  Heather [name], Heather [name]   Account Number:  [id number]  Date Initiated:  09/11/2013  Documentation initiated by:  [facility]  Subjective/Objective Assessment:   80 year old female admitted with FTT.     Action/Plan:   From home. PT/OT consults pending.   Anticipated DC Date:  09/13/2013   Anticipated DC Plan:  SKILLED NURSING FACILITY  In-house referral  Clinical Social Worker      DC Planning Services  CM consult      Choice offered to / List presented to:             Status of service:  Completed, signed off Medicare Important Message given?  YES (If response is "NO", the following Medicare IM given date fields will be blank) Date Medicare IM given:  09/13/2013 Medicare IM given by:  [facility] Date Additional Medicare IM given:   Additional Medicare IM given by:    Discharge Disposition:  [place]  Per UR Regulation:  Reviewed for med. necessity/level of care/duration of stay  If discussed at [place] of Stay Meetings, dates discussed:    Comments:

## 2013-09-11 NOTE — Progress Notes (Signed)
INITIAL NUTRITION ASSESSMENT  Pt meets criteria for severe MALNUTRITION in the context of chronic illness as evidenced by severe subcutaneous fat loss and muscle wasting in clavicles and arms with 12% weight loss in the past 9 months.  DOCUMENTATION CODES Per approved criteria  -Severe malnutrition in the context of chronic illness -Underweight   INTERVENTION: - Ensure Complete BID - Encouraged high calorie/protein choices - RD to continue to monitor   NUTRITION DIAGNOSIS: Increased nutrient needs related to pt underweight as evidenced by body mass index is 17.74 kg/(m^2).   Goal: Pt to consume >90% of meals/supplements  Monitor:  Weights, labs, intake   Reason for Assessment: Underweight   80 y.o. female  Admitting Dx: FTT (failure to thrive) in adult  ASSESSMENT: Pt with past medical history of spastic dysphonia, DJD, essential tremor, brought to emergency department by EMS with complaints of weakness and lower back pain. She presently resides home alone, having several episodes of "sliding off the couch" of the past several days, and today yesterday felt too weak to ambulate.   - Pt discussed during multidisciplinary rounds.  - Pt reports she eats 3 meals/day at home - oatmeal/raisins for breakfast, mozzarella sticks sticks and nutritious bread, and a lean cuisine for dinner - Not on any nutritional supplements at home - Ate 50% of lunch tray - meat sauce from spaghetti, most of her peaches, and half of her salad - Stated she weighed between 120-125 pounds 1 month ago, currently 103 pounds - Denies any problems chewing or swallowing and reports she wears a partial   Nutrition Focused Physical Exam:  Subcutaneous Fat:  Orbital Region: mild wasting  Upper Arm Region: severe wasting Thoracic and Lumbar Region: severe wasting  Muscle:  Temple Region: mild wasting Clavicle Bone Region: severe wasting Clavicle and Acromion Bone Region: severe wasting Scapular Bone  Region: NA Dorsal Hand: severe wasting Patellar Region: mild wasting Anterior Thigh Region: mild wasting Posterior Calf Region: mild wasting  Edema: None noted     Height: Ht Readings from Last 1 Encounters:  09/10/13 5\' 4"  (1.626 m)    Weight: Wt Readings from Last 1 Encounters:  09/10/13 103 lb 6.3 oz (46.9 kg)    Ideal Body Weight: 120 lbs   % Ideal Body Weight: 86%  Wt Readings from Last 10 Encounters:  09/10/13 103 lb 6.3 oz (46.9 kg)  12/23/12 117 lb 6.4 oz (53.252 kg)  12/05/12 122 lb (55.339 kg)  03/11/11 122 lb (55.339 kg)  08/19/11 121 lb (54.885 kg)  08/15/11 120 lb 9.6 oz (54.704 kg)  12/01/10 123 lb 9.6 oz (56.065 kg)  07/19/10 124 lb (56.246 kg)  06/16/10 125 lb 9.6 oz (56.972 kg)  05/31/10 125 lb (56.7 kg)    Usual Body Weight: 120-125 lbs per pt  % Usual Body Weight: 82-86%  BMI:  Body mass index is 17.74 kg/(m^2). Underweight  Estimated Nutritional Needs: Kcal: 1450-1650 Protein: 55-75g Fluid: 1.4-1.6L/day   Skin: Intact  Diet Order: General  EDUCATION NEEDS: -No education needs identified at this time   Intake/Output Summary (Last 24 hours) at 09/11/13 1414 Last data filed at 09/11/13 1348  Gross per 24 hour  Intake   1833 ml  Output      2 ml  Net   1831 ml    Last BM: 7/5   Labs:   Recent Labs Lab 09/10/13 1253 09/11/13 0511  NA 135* 140  K 3.8 3.8  CL 98 106  CO2 21 23  BUN  22 17  CREATININE 0.69 0.59  CALCIUM 9.2 8.6  GLUCOSE 103* 81    CBG (last 3)  No results found for this basename: GLUCAP,  in the last 72 hours  Scheduled Meds: . cefTRIAXone (ROCEPHIN)  IV  1 g Intravenous Q24H  . cycloSPORINE  1 drop Both Eyes Q12H  . enoxaparin (LOVENOX) injection  40 mg Subcutaneous Q24H    Continuous Infusions: . sodium chloride 75 mL/hr at 09/11/13 0034    Past Medical History  Diagnosis Date  . Hemoptysis   . 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 Raynaud's disease   . Fibroids uterine  . Tremor,  essential   . Osteopenia   . Pulmonary infiltrates april 2008    persistent ct infiltrates  . Dry eyes     and photosensitivity  . ANA positive 2007    1:640  . Spastic dysphonia     Botox therapy, Dr Okey Regal  . Bronchiectasis   . Hiatal hernia     presentation as chest pain    Past Surgical History  Procedure Laterality Date  . Dilation and curettage, diagnostic / therapeutic    . Colonoscopy  2005    negative  . Cataract surgery      bilaterally    Carlis Stable MS, RD, LDN [phone] Pager [phone] Weekend/After Hours Pager

## 2013-09-12 DIAGNOSIS — M5430 Sciatica, unspecified side: Secondary | ICD-10-CM | POA: Diagnosis not present

## 2013-09-12 DIAGNOSIS — E43 Unspecified severe protein-calorie malnutrition: Secondary | ICD-10-CM | POA: Insufficient documentation

## 2013-09-12 NOTE — Progress Notes (Signed)
OT Cancellation Note  Patient Details Name: Heather [name] MRN: [id number] DOB: Feb 02, 1930   Cancelled Treatment:    Reason Eval/Treat Not Completed: Other (comment).  Pt fatiqued. Had just finished getting cleaned up by NT.  Will check another day.  Indiyah Paone 09/12/2013, 3:38 PM Lesle Chris, OTR/L [phone] 09/12/2013

## 2013-09-12 NOTE — Progress Notes (Signed)
[facility] PROGRESS NOTE  Heather [name] DEY:[id number] DOB: 03-10-1929 DOA: 09/10/2013 PCP: Unice Cobble, MD  Assessment/Plan: Principal Problem:   FTT (failure to thrive) in adult Active Problems:   UTI (urinary tract infection)   Sciatica   Protein-calorie malnutrition, severe  Recurrent falls  Patient had evidence of severe spinal stenosis on MRI lumbar spine 12/07,Severe spinal stenosis at L4-5 due to 10 mm of slip  MRI of the sacrum showed bilateral sacral insufficiency fracture  MRI of the lumbar spine showed,Stable grade 1 anterolisthesis at L4-5 with annular disc bulging  contributing to mildly progressive severe spinal stenosis. Left  greater than right foraminal stenosis appears unchanged. Will benefit from outpatient neurosurgery consultation PT/OT consultation , needs SNF  Urinary tract infection  Continue Rocephin IV  Await urine culture  essential tremor, which is well controlled on Inderal LA 80 mg daily and primidone 250 mg b.i.d  Followed by [facility]  spasmodic dysphonia, 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 Huel Coventry    Code Status: full  Family Communication: family updated about patient's clinical progress  Disposition Plan: Anticipate discharge to SNF tomorrow    Brief narrative:  80 y.o. female with a past medical history of spastic dysphonia, DJD, essential tremor, brought to emergency department by EMS with complaints of weakness and lower back pain. She presently resides home alone, having several episodes of "sliding off the couch" of the past several days, and today felt too weak to ambulate. She complains of left buttock pain which radiates down her leg, thinks she may have turned "the wrong way" getting out of bed a few days ago. Otherwise she denies shortness of breath, dizziness, lightheadedness, chest pain, fevers, chills, nausea, vomiting, dysuria or hematuria. In the ER  initial labs unremarkable, although ua did show evidence of UTI.  Consultants:  1 none Procedures:  None Antibiotics:  None    HPI/Subjective: Still complaining of back pain  Objective: Filed Vitals:   09/11/13 0554 09/11/13 1912 09/11/13 2056 09/12/13 0614  BP: 135/73 128/70 124/72 124/70  Pulse: 83 85 94 56  Temp: 98.4 F (36.9 C) 98.5 F (36.9 C) 99 F (37.2 C) 98 F (36.7 C)  TempSrc: Oral Oral Oral Oral  Resp: 18 18 16 18   Height:      Weight:      SpO2: 95% 96% 95% 96%    Intake/Output Summary (Last 24 hours) at 09/12/13 1213 Last data filed at 09/12/13 1036  Gross per 24 hour  Intake 2628.75 ml  Output    302 ml  Net 2326.75 ml    Exam:  HENT:  Head: Atraumatic.  Nose: Nose normal.  Mouth/Throat: Oropharynx is clear and moist.  Eyes: Conjunctivae are normal. Pupils are equal, round, and reactive to light. No scleral icterus.  Neck: Neck supple. No tracheal deviation present.  Cardiovascular: Normal rate, regular rhythm, normal heart sounds and intact distal pulses.  Pulmonary/Chest: Effort normal and breath sounds normal. No respiratory distress.  Abdominal: Soft. Normal appearance and bowel sounds are normal. She exhibits no distension. There is no tenderness.  Musculoskeletal: She exhibits no edema and no tenderness.  Neurological: She is alert. No cranial nerve deficit.    Data Reviewed: Basic Metabolic Panel:  Recent Labs Lab 09/10/13 1253 09/11/13 0511  NA 135* 140  K 3.8 3.8  CL 98 106  CO2 21 23  GLUCOSE 103* 81  BUN 22 17  CREATININE 0.69 0.59  CALCIUM 9.2  8.6    Liver Function Tests: No results found for this basename: AST, ALT, ALKPHOS, BILITOT, PROT, ALBUMIN,  in the last 168 hours No results found for this basename: LIPASE, AMYLASE,  in the last 168 hours No results found for this basename: AMMONIA,  in the last 168 hours  CBC:  Recent Labs Lab 09/10/13 1253 09/11/13 0511  WBC 7.2 7.0  NEUTROABS 5.6  --   HGB 12.6  11.0*  HCT 39.4 34.7*  MCV 83.5 85.0  PLT 349 311    Cardiac Enzymes: No results found for this basename: CKTOTAL, CKMB, CKMBINDEX, TROPONINI,  in the last 168 hours BNP (last 3 results) No results found for this basename: PROBNP,  in the last 8760 hours   CBG: No results found for this basename: GLUCAP,  in the last 168 hours  Recent Results (from the past 240 hour(s))  CULTURE, BLOOD (ROUTINE X 2)     Status: None   Collection Time    09/10/13  4:35 PM      Result Value Ref Range Status   Specimen Description BLOOD RIGHT HAND   Final   Special Requests     Final   Value: BOTTLES DRAWN AEROBIC AND ANAEROBIC 10CC BLUE, 5 CC RED   Culture  Setup Time     Final   Value: 09/10/2013 19:07     Performed at [facility]   Culture     Final   Value:        BLOOD CULTURE RECEIVED NO GROWTH TO DATE CULTURE WILL BE HELD FOR 5 DAYS BEFORE ISSUING A FINAL NEGATIVE REPORT     Performed at [facility]   Report Status PENDING   Incomplete  CULTURE, BLOOD (ROUTINE X 2)     Status: None   Collection Time    09/10/13  4:49 PM      Result Value Ref Range Status   Specimen Description BLOOD LEFT ARM   Final   Special Requests     Final   Value: BOTTLES DRAWN AEROBIC AND ANAEROBIC 10 CC BLUE, 6 CC RED   Culture  Setup Time     Final   Value: 09/10/2013 19:07     Performed at [facility]   Culture     Final   Value:        BLOOD CULTURE RECEIVED NO GROWTH TO DATE CULTURE WILL BE HELD FOR 5 DAYS BEFORE ISSUING A FINAL NEGATIVE REPORT     Performed at [facility]   Report Status PENDING   Incomplete     Studies: Dg Hip Complete Left  09/10/2013   CLINICAL DATA:  Left hip pain with history of sciatica and recent fall  EXAM: LEFT HIP - COMPLETE 2+ VIEW  COMPARISON:  Left hip correction bilateral hip series of February 16, 2006  FINDINGS: The bony pelvis is osteopenic. There is no acute fracture. The observed portions of the sacrum are normal. The hip joint  spaces are reasonably well-maintained for age. AP and lateral views of the left hip reveal no acute fracture nor other bony abnormality.  IMPRESSION: There is no acute bony abnormality of the pelvis or left hip.   Electronically Signed   By: David  Martinique   On: 09/10/2013 09:51   Mr Lumbar Spine Wo Contrast  09/11/2013   CLINICAL DATA:  Low back pain radiating into the left hip. Status post fall. Evaluate for fracture versus disc herniation.  EXAM: MRI LUMBAR SPINE  WITHOUT CONTRAST  TECHNIQUE: Multiplanar, multisequence MR imaging of the lumbar spine was performed. No intravenous contrast was administered.  COMPARISON:  Lumbar MRI 02/22/2006.  FINDINGS: Five lumbar type vertebral bodies are assumed. There is a stable degenerative anterolisthesis at L4-5. There is no evidence of lumbar spine fracture or pars defect. Sacral fractures are described on the separate report of the sacrum.  The conus medullaris extends to the T12 level and appears normal. No paraspinal abnormalities are identified.There is mild extrahepatic biliary dilatation. The urinary bladder is markedly distended.  The discs remain fairly well hydrated with maintained height from T11-12 through L3-4. There is mild disc bulging at L3-4 asymmetric to the left. There is mild bilateral facet hypertrophy at L2-3 and L3-4, but no resulting spinal stenosis or nerve root encroachment.  L4-5: Again demonstrated is severe multifactorial spinal stenosis secondary to annular disc bulging, facet hypertrophy and the grade 1 anterolisthesis. The central and lateral recess stenosis appears mildly progressive. Left-greater-than-right foraminal stenosis appears unchanged.  L5-S1: Stable mild disc bulging and asymmetric right-sided facet hypertrophy. No significant spinal stenosis or nerve root encroachment.  IMPRESSION: 1. New bilateral sacral fractures, further described on MRI of the sacrum. 2. Stable grade 1 anterolisthesis at L4-5 with annular disc bulging  contributing to mildly progressive severe spinal stenosis. Left greater than right foraminal stenosis appears unchanged. 3. Stable mild left foraminal narrowing at L3-4.   Electronically Signed   By: Camie Patience M.D.   On: 09/11/2013 15:26   Mr Sacrum/si Joints Wo Contrast  09/11/2013   CLINICAL DATA:  Low back pain radiating into the left hip. Status post fall. Evaluate for fracture versus disc herniation.  EXAM: MR SACRUM WITHOUT CONTRAST  TECHNIQUE: Multiplanar, multisequence MR imaging was performed. No intravenous contrast was administered.  COMPARISON:  Lumbar MRI 02/22/2006.  FINDINGS: Bilateral sacral fractures have an H shape configuration. There is mild displacement of the anterior cortex at S1-2 on the sagittal images. There is associated marrow edema within the sacral ala bilaterally. The sacroiliac joints are intact. The remainder of the visualized bony pelvis appears intact.  The urinary bladder is significantly distended. No focal pelvic fluid collections or inflammatory changes are identified. There is a small right hip joint effusion.  IMPRESSION: Bilateral sacral fractures as described. Configuration is typical for insufficiency fractures, although could be posttraumatic given the patient's recent fall. Distended urinary bladder.  Lumbar spine findings are dictated separately.   Electronically Signed   By: Camie Patience M.D.   On: 09/11/2013 15:31    Scheduled Meds: . cefTRIAXone (ROCEPHIN)  IV  1 g Intravenous Q24H  . cycloSPORINE  1 drop Both Eyes Q12H  . enoxaparin (LOVENOX) injection  40 mg Subcutaneous Q24H  . feeding supplement (ENSURE COMPLETE)  237 mL Oral BID BM   Continuous Infusions: . sodium chloride 75 mL/hr at 09/11/13 1600    Principal Problem:   FTT (failure to thrive) in adult Active Problems:   UTI (urinary tract infection)   Sciatica   Protein-calorie malnutrition, severe    Time spent: 40 minutes   [facility] Pager [phone]. If  8PM-8AM, please contact night-coverage at [email or web address], password [facility] 09/12/2013, 12:13 PM  LOS: 2 days

## 2013-09-12 NOTE — Progress Notes (Signed)
Clinical Social Work  CSW met with patient at bedside while she was eating lunch. Patient reports that she knows she needs rehab at DC but is disappointed that she cannot return home. CSW provided SNF bed offers and patient reports she will review list and have a decision by tomorrow morning. CSW will continue to follow.  [place], [place] [employer] [phone]

## 2013-09-12 NOTE — Progress Notes (Signed)
Physical Therapy Treatment Patient Details Name: Heather [name] MRN: [id number] DOB: 1929/05/02 Today's Date: 09/12/2013    History of Present Illness 80 yo female admitted with L LE pain(buttock/hip down to foot), back pain. Pt lives alone    PT Comments    Pt able to stand and take a few steps on today but not without significant/severe pain. Will need SNF.   Follow Up Recommendations  SNF     Equipment Recommendations  Rolling walker with 5" wheels    Recommendations for Other Services OT consult     Precautions / Restrictions Precautions Precautions: Fall;Back Precaution Comments: for safety, ease of mobility as able Restrictions Weight Bearing Restrictions: No    Mobility  Bed Mobility Overal bed mobility: Needs Assistance Bed Mobility: Rolling;Sidelying to Sit;Sit to Sidelying Rolling: Min assist Sidelying to sit: Mod assist     Sit to sidelying: Mod assist General bed mobility comments: assist for trunk and bil LEs. Increased time.   Transfers Overall transfer level: Needs assistance Equipment used: Rolling walker (2 wheeled) Transfers: Sit to/from Stand Sit to Stand: From elevated surface;Mod assist         General transfer comment: assist to rise, stabilize, control descent. Pt needed to pull up on walker to get to standing. Increased time.   Ambulation/Gait Ambulation/Gait assistance: Mod assist Ambulation Distance (Feet): 5 Feet (forwards and then backwards)   Gait Pattern/deviations: Step-to pattern;Decreased step length - left;Decreased step length - right;Decreased stride length;Antalgic;Trunk flexed     General Gait Details: Pt able to weightbear but not without significant pain. distance significantly limited by pain. assist to stabilize/support pt throughout ambulation.    Stairs            Wheelchair Mobility    Modified Rankin (Stroke Patients Only)       Balance                                    Cognition  Arousal/Alertness: Awake/alert Behavior During Therapy: WFL for tasks assessed/performed Overall Cognitive Status: Within Functional Limits for tasks assessed                      Exercises      General Comments        Pertinent Vitals/Pain 6/10 at rest; 9/10 with activity. Repositioned back in bed.     Home Living                      Prior Function            PT Goals (current goals can now be found in the care plan section) Progress towards PT goals: Progressing toward goals (very slowly)    Frequency  Min 3X/week    PT Plan Current plan remains appropriate    Co-evaluation             End of Session Equipment Utilized During Treatment: Gait belt Activity Tolerance: Patient limited by pain Patient left: with call bell/phone within reach     Time: 1041-1057 PT Time Calculation (min): 16 min  Charges:  $Gait Training: 8-22 mins                    G Codes:      Weston Anna, MPT Pager: [phone]

## 2013-09-13 DIAGNOSIS — IMO0001 Reserved for inherently not codable concepts without codable children: Secondary | ICD-10-CM | POA: Diagnosis not present

## 2013-09-13 DIAGNOSIS — Z7901 Long term (current) use of anticoagulants: Secondary | ICD-10-CM | POA: Diagnosis not present

## 2013-09-13 DIAGNOSIS — M5126 Other intervertebral disc displacement, lumbar region: Secondary | ICD-10-CM | POA: Diagnosis not present

## 2013-09-13 DIAGNOSIS — IMO0002 Reserved for concepts with insufficient information to code with codable children: Secondary | ICD-10-CM | POA: Diagnosis not present

## 2013-09-13 DIAGNOSIS — N390 Urinary tract infection, site not specified: Secondary | ICD-10-CM | POA: Diagnosis not present

## 2013-09-13 DIAGNOSIS — J383 Other diseases of vocal cords: Secondary | ICD-10-CM | POA: Diagnosis not present

## 2013-09-13 DIAGNOSIS — R627 Adult failure to thrive: Secondary | ICD-10-CM | POA: Diagnosis not present

## 2013-09-13 DIAGNOSIS — Z9181 History of falling: Secondary | ICD-10-CM | POA: Diagnosis not present

## 2013-09-13 DIAGNOSIS — M48062 Spinal stenosis, lumbar region with neurogenic claudication: Secondary | ICD-10-CM | POA: Diagnosis not present

## 2013-09-13 DIAGNOSIS — Z5189 Encounter for other specified aftercare: Secondary | ICD-10-CM | POA: Diagnosis not present

## 2013-09-13 DIAGNOSIS — G250 Essential tremor: Secondary | ICD-10-CM | POA: Diagnosis not present

## 2013-09-13 DIAGNOSIS — M5430 Sciatica, unspecified side: Secondary | ICD-10-CM | POA: Diagnosis not present

## 2013-09-13 DIAGNOSIS — A4901 Methicillin susceptible Staphylococcus aureus infection, unspecified site: Secondary | ICD-10-CM | POA: Diagnosis not present

## 2013-09-13 DIAGNOSIS — E43 Unspecified severe protein-calorie malnutrition: Secondary | ICD-10-CM | POA: Diagnosis not present

## 2013-09-13 DIAGNOSIS — I824Z9 Acute embolism and thrombosis of unspecified deep veins of unspecified distal lower extremity: Secondary | ICD-10-CM | POA: Diagnosis not present

## 2013-09-13 DIAGNOSIS — M6281 Muscle weakness (generalized): Secondary | ICD-10-CM | POA: Diagnosis not present

## 2013-09-13 DIAGNOSIS — I82409 Acute embolism and thrombosis of unspecified deep veins of unspecified lower extremity: Secondary | ICD-10-CM | POA: Diagnosis not present

## 2013-09-13 DIAGNOSIS — R262 Difficulty in walking, not elsewhere classified: Secondary | ICD-10-CM | POA: Diagnosis not present

## 2013-09-13 DIAGNOSIS — Q762 Congenital spondylolisthesis: Secondary | ICD-10-CM | POA: Diagnosis not present

## 2013-09-13 MED ORDER — CEPHALEXIN 500 MG PO CAPS
500.0000 mg | ORAL_CAPSULE | Freq: Three times a day (TID) | ORAL | Status: DC
Start: 2013-09-13 — End: 2013-09-13
  Administered 2013-09-13: 500 mg via ORAL
  Filled 2013-09-13 (×4): qty 1

## 2013-09-13 MED ORDER — OXYCODONE HCL 5 MG PO TABS: 5.0000 mg | ORAL_TABLET | ORAL | Status: DC | PRN

## 2013-09-13 MED ORDER — ENOXAPARIN SODIUM 40 MG/0.4ML ~~LOC~~ SOLN: 40.0000 mg | SUBCUTANEOUS | Status: DC

## 2013-09-13 MED ORDER — CEPHALEXIN 500 MG PO CAPS: 500.0000 mg | ORAL_CAPSULE | Freq: Three times a day (TID) | ORAL | Status: DC

## 2013-09-13 MED ORDER — NAPROXEN 500 MG PO TABS
500.0000 mg | ORAL_TABLET | Freq: Two times a day (BID) | ORAL | Status: DC
  Administered 2013-09-13: 500 mg via ORAL
  Filled 2013-09-13 (×3): qty 1

## 2013-09-13 MED ORDER — CEPHALEXIN 500 MG PO CAPS: 500.0000 mg | ORAL_CAPSULE | Freq: Three times a day (TID) | ORAL | Status: AC

## 2013-09-13 NOTE — Discharge Instructions (Addendum)
°   bmp weekly monitor renal function

## 2013-09-13 NOTE — Discharge Summary (Addendum)
Physician Discharge Summary  Heather [name] MRN: [id number] DOB/AGE: May 19, 1929 80 y.o.  PCP: Unice Cobble, MD   Admit date: 09/10/2013 Discharge date: 09/13/2013  Discharge Diagnoses:  Insufficiency fractures of sacrum   FTT (failure to thrive) in adult Active Problems:   UTI (urinary tract infection)   Sciatica   Protein-calorie malnutrition, severe  Followup recommendation #1 BMP weekly #2 fall precautions #3 CBC weekly     Medication List         cephALEXin 500 MG capsule  Commonly known as:  KEFLEX  Take 1 capsule (500 mg total) by mouth every 8 (eight) hours.stop 7/15     cycloSPORINE 0.05 % ophthalmic emulsion  Commonly known as:  RESTASIS  Place 1 drop into both eyes every 12 (twelve) hours.     enoxaparin 40 MG/0.4ML injection  Commonly known as:  LOVENOX  Inject 0.4 mLs (40 mg total) into the skin daily.for 30 days      hydroxypropyl methylcellulose 2.5 % ophthalmic solution  Commonly known as:  ISOPTO TEARS  Place 2 drops into both eyes 4 (four) times daily as needed for dry eyes.     multivitamin with minerals tablet  Take 1 tablet by mouth daily.     naproxen sodium 220 MG tablet  Commonly known as:  ANAPROX  Take 220 mg by mouth 2 (two) times daily as needed (pain).     oxyCODONE 5 MG immediate release tablet  Commonly known as:  Oxy IR/ROXICODONE  Take 1 tablet (5 mg total) by mouth every 4 (four) hours as needed for moderate pain.     primidone 250 MG tablet  Commonly known as:  MYSOLINE  Take 250 mg by mouth every morning.     propranolol ER 80 MG 24 hr capsule  Commonly known as:  INDERAL LA  Take 80 mg by mouth every morning.        Discharge Condition: Stable  Disposition: SNF   Consults: None  Significant Diagnostic Studies: Dg Hip Complete Left  09/10/2013   CLINICAL DATA:  Left hip pain with history of sciatica and recent fall  EXAM: LEFT HIP - COMPLETE 2+ VIEW  COMPARISON:  Left hip correction bilateral hip series of  February 16, 2006  FINDINGS: The bony pelvis is osteopenic. There is no acute fracture. The observed portions of the sacrum are normal. The hip joint spaces are reasonably well-maintained for age. AP and lateral views of the left hip reveal no acute fracture nor other bony abnormality.  IMPRESSION: There is no acute bony abnormality of the pelvis or left hip.   Electronically Signed   By: David  Martinique   On: 09/10/2013 09:51   Mr Lumbar Spine Wo Contrast  09/11/2013   CLINICAL DATA:  Low back pain radiating into the left hip. Status post fall. Evaluate for fracture versus disc herniation.  EXAM: MRI LUMBAR SPINE WITHOUT CONTRAST  TECHNIQUE: Multiplanar, multisequence MR imaging of the lumbar spine was performed. No intravenous contrast was administered.  COMPARISON:  Lumbar MRI 02/22/2006.  FINDINGS: Five lumbar type vertebral bodies are assumed. There is a stable degenerative anterolisthesis at L4-5. There is no evidence of lumbar spine fracture or pars defect. Sacral fractures are described on the separate report of the sacrum.  The conus medullaris extends to the T12 level and appears normal. No paraspinal abnormalities are identified.There is mild extrahepatic biliary dilatation. The urinary bladder is markedly distended.  The discs remain fairly well hydrated with maintained height from  T11-12 through L3-4. There is mild disc bulging at L3-4 asymmetric to the left. There is mild bilateral facet hypertrophy at L2-3 and L3-4, but no resulting spinal stenosis or nerve root encroachment.  L4-5: Again demonstrated is severe multifactorial spinal stenosis secondary to annular disc bulging, facet hypertrophy and the grade 1 anterolisthesis. The central and lateral recess stenosis appears mildly progressive. Left-greater-than-right foraminal stenosis appears unchanged.  L5-S1: Stable mild disc bulging and asymmetric right-sided facet hypertrophy. No significant spinal stenosis or nerve root encroachment.  IMPRESSION:  1. New bilateral sacral fractures, further described on MRI of the sacrum. 2. Stable grade 1 anterolisthesis at L4-5 with annular disc bulging contributing to mildly progressive severe spinal stenosis. Left greater than right foraminal stenosis appears unchanged. 3. Stable mild left foraminal narrowing at L3-4.   Electronically Signed   By: Camie Patience M.D.   On: 09/11/2013 15:26   Mr Sacrum/si Joints Wo Contrast  09/11/2013   CLINICAL DATA:  Low back pain radiating into the left hip. Status post fall. Evaluate for fracture versus disc herniation.  EXAM: MR SACRUM WITHOUT CONTRAST  TECHNIQUE: Multiplanar, multisequence MR imaging was performed. No intravenous contrast was administered.  COMPARISON:  Lumbar MRI 02/22/2006.  FINDINGS: Bilateral sacral fractures have an H shape configuration. There is mild displacement of the anterior cortex at S1-2 on the sagittal images. There is associated marrow edema within the sacral ala bilaterally. The sacroiliac joints are intact. The remainder of the visualized bony pelvis appears intact.  The urinary bladder is significantly distended. No focal pelvic fluid collections or inflammatory changes are identified. There is a small right hip joint effusion.  IMPRESSION: Bilateral sacral fractures as described. Configuration is typical for insufficiency fractures, although could be posttraumatic given the patient's recent fall. Distended urinary bladder.  Lumbar spine findings are dictated separately.   Electronically Signed   By: Camie Patience M.D.   On: 09/11/2013 15:31       Microbiology: Recent Results (from the past 240 hour(s))  URINE CULTURE     Status: None   Collection Time    09/10/13  2:38 PM      Result Value Ref Range Status   Specimen Description URINE, CLEAN CATCH   Final   Special Requests NONE   Final   Culture  Setup Time     Final   Value: 09/11/2013 14:25     Performed at [facility]     Final   Value: 55,000  COLONIES/ML     Performed at [facility]   Culture     Final   Value: STAPHYLOCOCCUS AUREUS     Note: RIFAMPIN AND GENTAMICIN SHOULD NOT BE USED AS SINGLE DRUGS FOR TREATMENT OF STAPH INFECTIONS.     Performed at [facility]   Report Status PENDING   Incomplete  CULTURE, BLOOD (ROUTINE X 2)     Status: None   Collection Time    09/10/13  4:35 PM      Result Value Ref Range Status   Specimen Description BLOOD RIGHT HAND   Final   Special Requests     Final   Value: BOTTLES DRAWN AEROBIC AND ANAEROBIC 10CC BLUE, 5 CC RED   Culture  Setup Time     Final   Value: 09/10/2013 19:07     Performed at [facility]   Culture     Final   Value:        BLOOD CULTURE  RECEIVED NO GROWTH TO DATE CULTURE WILL BE HELD FOR 5 DAYS BEFORE ISSUING A FINAL NEGATIVE REPORT     Performed at [facility]   Report Status PENDING   Incomplete  CULTURE, BLOOD (ROUTINE X 2)     Status: None   Collection Time    09/10/13  4:49 PM      Result Value Ref Range Status   Specimen Description BLOOD LEFT ARM   Final   Special Requests     Final   Value: BOTTLES DRAWN AEROBIC AND ANAEROBIC 10 CC BLUE, 6 CC RED   Culture  Setup Time     Final   Value: 09/10/2013 19:07     Performed at [facility]   Culture     Final   Value:        BLOOD CULTURE RECEIVED NO GROWTH TO DATE CULTURE WILL BE HELD FOR 5 DAYS BEFORE ISSUING A FINAL NEGATIVE REPORT     Performed at [facility]   Report Status PENDING   Incomplete     Labs: No results found for this or any previous visit (from the past 90 hour(s)).   HPI : 80y.o. female with a past medical history of spastic dysphonia, DJD, essential tremor, brought to emergency department by EMS with complaints of weakness and lower back pain. She presently resides home alone, having several episodes of "sliding off the couch" of the past several days, and today felt too weak to ambulate. She complains of left buttock pain which  radiates down her leg, thinks she may have turned "the wrong way" getting out of bed a few days ago. Otherwise she denies shortness of breath, dizziness, lightheadedness, chest pain, fevers, chills, nausea, vomiting, dysuria or hematuria. In the ER initial labs unremarkable, although ua did show evidence of UTI.    HOSPITAL COURSE:  Recurrent falls  Patient had evidence of severe spinal stenosis on MRI lumbar spine 12/07,Severe spinal stenosis at L4-5 due to 10 mm of slip  we repeated MRI of the sacrum that showed bilateral sacral insufficiency fracture  MRI of the lumbar spine showed,Stable grade 1 anterolisthesis at L4-5 with annular disc bulging  contributing to mildly progressive severe spinal stenosis. Left  greater than right foraminal stenosis appears unchanged.  Patient will benefit from outpatient neurosurgery consultation , to be arranged for by PCP PT/OT consultation , recommended SNF SNF discharge today Naprosyn and when necessary oxycodone for pain Lovenox for DVT prophylaxis for 30 days  Urinary tract infection  Treated with Rocephin IV x3 days Await urine culture  Continue Keflex, patient allergic to Cipro  essential tremor, which is well controlled on Inderal LA 80 mg daily and primidone 250 mg b.i.d  Followed by [facility]   spasmodic dysphonia, for which she receives botulinum toxin injections at [place], formerly with Dr. Huel Coventry       Discharge Exam: Blood pressure 144/70, pulse 94, temperature 98.2 F (36.8 C), temperature source Oral, resp. rate 18, height 5\' 4"  (1.626 m), weight 46.9 kg (103 lb 6.3 oz), SpO2 95.00%.  Cardiovascular: Normal rate, regular rhythm, normal heart sounds and intact distal pulses.  Pulmonary/Chest: Effort normal and breath sounds normal. No respiratory distress.  Abdominal: Soft. Normal appearance and bowel sounds are normal. She exhibits no distension. There is no tenderness.  Musculoskeletal: She exhibits  no edema and no tenderness.  Neurological: She is alert. No cranial nerve deficit.          Discharge  Instructions   Diet - low sodium heart healthy    Complete by:  As directed      Increase activity slowly    Complete by:  As directed              Signed: Corisa Montini 09/13/2013, 11:22 AM

## 2013-09-13 NOTE — Progress Notes (Signed)
Clinical Social Work  CSW faxed DC summary to [facility] who is agreeable to accept today. CSW informed patient and great-niece of DC plans and all parties agreeable. Patient states she lives near [place] and is happy that she will be close to home. CSW prepared DC packet with FL2, hard scripts, and DC summary included. Patient requests [facility] to transport and is aware of no guarantee of payment. [facility] request #: [id number].  CSW is signing off but available if needed.  [place], [place] [phone]

## 2013-09-13 NOTE — Progress Notes (Signed)
Clinical Social Work  Patient has chosen a bed at [facility]. CSW spoke with SNF who is agreeable to accept today. Patient agreeable for CSW to call her great-niece Joellen [place]) to update her on DC plans. Patient reports no other relatives but that niece travels often for work. CSW left niece a message with CSW contact information. CSW will continue to follow and will assist with transfer to SNF when medically stable.  [place], [place] [phone]

## 2013-09-13 NOTE — Progress Notes (Signed)
Report called to [employer] at [facility]. Staff has no questions at this time.

## 2013-09-13 NOTE — Progress Notes (Addendum)
Occupational Therapy Treatment Patient Details Name: LETIA GUIDRY MRN: [id number] DOB: [birth year] Today's Date: 09/13/2013    History of present illness 80 yo female admitted with L LE pain(buttock/hip down to foot), back pain. Pt lives alone   OT comments  Pt progressing slowly.  Pt is not willing to stand/squat pivot to [facility].  Agreeable to EOB today  Follow Up Recommendations  SNF    Equipment Recommendations   (defer to snf)    Recommendations for Other Services      Precautions / Restrictions Precautions Precautions: Fall;Back       Mobility Bed Mobility   Bed Mobility: Rolling;Sidelying to Sit;Sit to Sidelying Rolling: Min assist Sidelying to sit: Mod assist Supine to sit: Mod assist;HOB elevated     General bed mobility comments: assist for trunk sitting up and assist for legs lying down.  Increased time.  Transfers                      Balance                                   ADL Overall ADL's : Needs assistance/impaired     Grooming: Wash/dry hands;Wash/dry face;Set up;Bed level                                 General ADL Comments: Initially pt on bed pan and assisted off; pain too great for furtther activity.  Pt stated she could not reach for toilet hygiene. Pt stays on bedpan for long periods due to dripping:  Bedpan leaves marks on pt.  Encouraged to not stay on beyond 15 minutes.   Encouraged to do as much as she can for herself during all ADLs.  Demonstrated squat pivot to [facility] but pt refused.  Also got adjustable recliner and showed pt that she could lean back for comfort but she refuses.  Educated on importance of getting up to keep strength up and weight bearing for bones.  She agrees but is not willing to do these things.  Also encouraged to participate as much as possible here to decrease recovery time at [facility].  Pt did sit EOB for 5 minutes.  Pt did not want to perform any grooming activities sitting:  agreeable when  lying in bed      Vision                     Perception     Praxis      Cognition   Behavior During Therapy: [facility] for tasks assessed/performed Overall Cognitive Status: Within Functional Limits for tasks assessed                       Extremity/Trunk Assessment               Exercises     Shoulder Instructions       General Comments      Pertinent Vitals/ Pain       *moderate in bed; high sitting.  Premedicated and repositioned  Home Living                                          Prior Functioning/Environment  Frequency Min 2X/week     Progress Toward Goals  OT Goals(current goals can now be found in the care plan section)  Progress towards OT goals:  (very slow progress; pt self-limiting)     Plan      Co-evaluation                 End of Session     Activity Tolerance Patient limited by pain   Patient Left in bed;with call bell/phone within reach   Nurse Communication          Time: [id number] and 748 - 759 OT Time Calculation (min):42 min  Charges: OT General Charges $OT Visit: 1 Procedure OT Treatments $Therapeutic Activity: 23-37 mins 1 Self Care 8-22 mins Juniper Cobey 09/13/2013, 9:27 AM  Lesle Chris, OTR/L [phone] 09/13/2013

## 2013-09-14 LAB — URINE CULTURE: Colony Count: 55000

## 2013-09-16 LAB — CULTURE, BLOOD (ROUTINE X 2)
CULTURE: NO GROWTH
Culture: NO GROWTH

## 2013-09-18 ENCOUNTER — Encounter: Payer: Self-pay | Admitting: Internal Medicine

## 2013-09-18 ENCOUNTER — Non-Acute Institutional Stay (SKILLED_NURSING_FACILITY): Payer: Medicare Other | Admitting: Internal Medicine

## 2013-09-18 DIAGNOSIS — J383 Other diseases of vocal cords: Secondary | ICD-10-CM | POA: Diagnosis not present

## 2013-09-18 DIAGNOSIS — N390 Urinary tract infection, site not specified: Secondary | ICD-10-CM | POA: Diagnosis not present

## 2013-09-18 DIAGNOSIS — G252 Other specified forms of tremor: Secondary | ICD-10-CM

## 2013-09-18 DIAGNOSIS — G250 Essential tremor: Secondary | ICD-10-CM

## 2013-09-18 DIAGNOSIS — Z9181 History of falling: Secondary | ICD-10-CM

## 2013-09-18 DIAGNOSIS — R296 Repeated falls: Secondary | ICD-10-CM

## 2013-09-18 NOTE — Progress Notes (Signed)
MRN: [id number] Name: Heather [name]  Sex: female Age: 80 y.o. DOB: [birth year]  [place] [facility]: [facility] Facility/Room: 779 Level Of Care: SNF Provider: Inocencio Homes D Emergency Contacts: Extended Emergency Contact Information Primary Emergency Contact: Olevia Bowens [place] of [place] Phone: [phone] Mobile Phone: [phone] Relation: Relative Secondary Emergency Contact: Shirley Friar, [place] Phone: [phone] Relation: Friend  Code Status:   Allergies: Ciprofloxacin and Phenazopyridine hcl  Chief Complaint  Patient presents with  . nursing home admission    HPI: Patient is 80 y.o. female who has had multiple falls, with spinal stenosis who is admitted for OT/PT.  Past Medical History  Diagnosis Date  . Hemoptysis   . 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 Raynaud's disease   . Fibroids uterine  . Tremor, essential   . Osteopenia   . Pulmonary infiltrates april 2008    persistent ct infiltrates  . Dry eyes     and photosensitivity  . ANA positive 2007    1:640  . Spastic dysphonia     Botox therapy, Dr Okey Regal  . Bronchiectasis   . Hiatal hernia     presentation as chest pain    Past Surgical History  Procedure Laterality Date  . Dilation and curettage, diagnostic / therapeutic    . Colonoscopy  2005    negative  . Cataract surgery      bilaterally      Medication List       This list is accurate as of: 09/18/13 11:59 PM.  Always use your most recent med list.               cephALEXin 500 MG capsule  Commonly known as:  KEFLEX  Take 1 capsule (500 mg total) by mouth every 8 (eight) hours.     cycloSPORINE 0.05 % ophthalmic emulsion  Commonly known as:  RESTASIS  Place 1 drop into both eyes every 12 (twelve) hours.     enoxaparin 40 MG/0.4ML injection  Commonly known as:  LOVENOX  Inject 0.4 mLs (40 mg total) into the skin daily.     hydroxypropyl methylcellulose  2.5 % ophthalmic solution  Commonly known as:  ISOPTO TEARS  Place 2 drops into both eyes 4 (four) times daily as needed for dry eyes.     multivitamin with minerals tablet  Take 1 tablet by mouth daily.     naproxen sodium 220 MG tablet  Commonly known as:  ANAPROX  Take 220 mg by mouth 2 (two) times daily as needed (pain).     oxyCODONE 5 MG immediate release tablet  Commonly known as:  Oxy IR/ROXICODONE  Take 1 tablet (5 mg total) by mouth every 4 (four) hours as needed for moderate pain.     primidone 250 MG tablet  Commonly known as:  MYSOLINE  Take 250 mg by mouth every morning.     propranolol ER 80 MG 24 hr capsule  Commonly known as:  INDERAL LA  Take 80 mg by mouth every morning.        No orders of the defined types were placed in this encounter.    Immunization History  Administered Date(s) Administered  . Influenza Whole 02/08/2007, 01/16/2008, 12/02/2009  . Pneumococcal Polysaccharide-23 12/01/2010  . Td 10/13/2004    History  Substance Use Topics  . Smoking status: Former Smoker -- 0.30 packs/day for 18 years  Types: Cigarettes    Quit date: 03/07/1965  . Smokeless tobacco: Never Used     Comment: started smoking at age 19  . Alcohol Use: No    Family history is noncontributory    Review of Systems  DATA OBTAINED: from patient GENERAL: Feels well no fevers, fatigue, appetite changes SKIN: No itching, rash or wounds EYES: No eye pain, redness, discharge EARS: No earache, tinnitus, change in hearing NOSE: No congestion, drainage or bleeding  MOUTH/THROAT: No mouth or tooth pain  RESPIRATORY: No cough, wheezing, SOB CARDIAC: No chest pain, palpitations, lower extremity edema  GI: No abdominal pain, No N/V/D or constipation, No heartburn or reflux  GU: No dysuria, frequency or urgency, or incontinence  MUSCULOSKELETAL: No unrelieved bone/joint pain NEUROLOGIC: No headache, dizziness  PSYCHIATRIC: No overt anxiety or sadness. Sleeps well. No  behavior issue.   Filed Vitals:   09/18/13 1202  BP: 147/76  Pulse: 75  Temp: 97.1 F (36.2 C)  Resp: 20    Physical Exam  GENERAL APPEARANCE: Alert, conversant. Appropriately groomed. No acute distress.  SKIN: No diaphoresis rash HEAD: Normocephalic, atraumatic  EYES: Conjunctiva/lids clear. Pupils round, reactive. EOMs intact.  EARS: External exam WNL, canals clear. Hearing grossly normal.  NOSE: No deformity or discharge.  MOUTH/THROAT: Lips w/o lesions.  RESPIRATORY: Breathing is even, unlabored. Lung sounds are clear   CARDIOVASCULAR: Heart RRR no murmurs, rubs or gallops. No peripheral edema.   GASTROINTESTINAL: Abdomen is soft, non-tender, not distended w/ normal bowel sounds GENITOURINARY: Bladder non tender, not distended  MUSCULOSKELETAL: No abnormal joints or musculature NEUROLOGIC:  Cranial nerves 2-12 grossly intact. Moves all extremities no tremor. PSYCHIATRIC: Mood and affect appropriate to situation, no behavioral issues  Patient Active Problem List   Diagnosis Date Noted  . Recurrent falls 09/18/2013  . Protein-calorie malnutrition, severe 09/12/2013  . UTI (urinary tract infection) 09/10/2013  . Sciatica 09/10/2013  . FTT (failure to thrive) in adult 09/10/2013  . Unspecified adverse effect of unspecified drug, medicinal and biological substance 08/19/2011  . Hyperlipidemia 12/01/2010  . VOCAL CORD DISORDER 04/27/2007  . HEMOPTYSIS 02/26/2007  . DISEASE, PULMONARY D/T MYCOBACTERIA 12/22/2006  . FIBROIDS, UTERUS 06/12/2006  . TREMOR, ESSENTIAL 06/12/2006  . RAYNAUD'S DISEASE 06/12/2006  . OSTEOPENIA 06/12/2006    CBC    Component Value Date/Time   WBC 7.0 09/11/2013 0511   RBC 4.42 09/11/2013 0831   RBC 4.08 09/11/2013 0511   HGB 11.0* 09/11/2013 0511   HCT 34.7* 09/11/2013 0511   PLT 311 09/11/2013 0511   MCV 85.0 09/11/2013 0511   LYMPHSABS 0.8 09/10/2013 1253   MONOABS 0.7 09/10/2013 1253   EOSABS 0.1 09/10/2013 1253   BASOSABS 0.0 09/10/2013 1253    CMP      Component Value Date/Time   NA 140 09/11/2013 0511   K 3.8 09/11/2013 0511   CL 106 09/11/2013 0511   CO2 23 09/11/2013 0511   GLUCOSE 81 09/11/2013 0511   BUN 17 09/11/2013 0511   CREATININE 0.59 09/11/2013 0511   CALCIUM 8.6 09/11/2013 0511   PROT 7.2 08/15/2011 1643   ALBUMIN 3.4* 08/15/2011 1643   AST 22 08/15/2011 1643   ALT 17 08/15/2011 1643   ALKPHOS 61 08/15/2011 1643   BILITOT 0.3 08/15/2011 1643   GFRNONAA 83* 09/11/2013 0511   GFRAA >90 09/11/2013 0511    Assessment and Plan  Recurrent falls Patient had evidence of severe spinal stenosis on MRI lumbar spine 12/07,Severe spinal stenosis at L4-5 due to 10 mm  of slip  we repeated MRI of the sacrum that showed bilateral sacral insufficiency fracture  MRI of the lumbar spine showed,Stable grade 1 anterolisthesis at L4-5 with annular disc bulging  contributing to mildly progressive severe spinal stenosis. Left  greater than right foraminal stenosis appears unchanged.  Patient will benefit from outpatient neurosurgery consultation , to be arranged for by PCP  PT/OT consultation , recommended SNF  SNF discharge today  Naprosyn and when necessary oxycodone for pain  Lovenox for DVT prophylaxis for 30 days   UTI (urinary tract infection) Treated with Rocephin IV x3 days  Await urine culture  Continue Keflex, patient allergic to Cipro   TREMOR, ESSENTIAL which is well controlled on Inderal LA 80 mg daily and primidone 250 mg b.i.d  Followed by [facility]    [place] 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 Haynes Kerns, Noah Delaine, MD

## 2013-09-18 NOTE — Assessment & Plan Note (Signed)
which is well controlled on Inderal LA 80 mg daily and primidone 250 mg b.i.d  Followed by [facility]

## 2013-09-18 NOTE — Assessment & Plan Note (Signed)
Patient had evidence of severe spinal stenosis on MRI lumbar spine 12/07,Severe spinal stenosis at L4-5 due to 10 mm of slip  we repeated MRI of the sacrum that showed bilateral sacral insufficiency fracture  MRI of the lumbar spine showed,Stable grade 1 anterolisthesis at L4-5 with annular disc bulging  contributing to mildly progressive severe spinal stenosis. Left  greater than right foraminal stenosis appears unchanged.  Patient will benefit from outpatient neurosurgery consultation , to be arranged for by PCP  PT/OT consultation , recommended SNF  SNF discharge today  Naprosyn and when necessary oxycodone for pain  Lovenox for DVT prophylaxis for 30 days

## 2013-09-18 NOTE — Assessment & Plan Note (Signed)
for which she receives botulinum toxin injections at [place], formerly with Dr. Huel Coventry

## 2013-09-18 NOTE — Assessment & Plan Note (Signed)
Treated with Rocephin IV x3 days  Await urine culture  Continue Keflex, patient allergic to Cipro

## 2013-09-19 ENCOUNTER — Telehealth: Payer: Self-pay | Admitting: Neurology

## 2013-09-19 NOTE — Telephone Encounter (Signed)
[place] from [facility] calling to schedule a sooner appointment for patient, states that patient had a hospital stay recently and the doctor instructed her to follow up with her neurologist. Please return call and advise.

## 2013-09-22 ENCOUNTER — Encounter: Payer: Self-pay | Admitting: Internal Medicine

## 2013-10-16 DIAGNOSIS — Q762 Congenital spondylolisthesis: Secondary | ICD-10-CM | POA: Diagnosis not present

## 2013-10-16 DIAGNOSIS — M48062 Spinal stenosis, lumbar region with neurogenic claudication: Secondary | ICD-10-CM | POA: Diagnosis not present

## 2013-10-21 ENCOUNTER — Non-Acute Institutional Stay (SKILLED_NURSING_FACILITY): Payer: Medicare Other | Admitting: Internal Medicine

## 2013-10-21 ENCOUNTER — Non-Acute Institutional Stay: Payer: Medicare Other | Admitting: Internal Medicine

## 2013-10-21 ENCOUNTER — Encounter: Payer: Self-pay | Admitting: Internal Medicine

## 2013-10-21 DIAGNOSIS — N390 Urinary tract infection, site not specified: Secondary | ICD-10-CM

## 2013-10-21 DIAGNOSIS — I82402 Acute embolism and thrombosis of unspecified deep veins of left lower extremity: Secondary | ICD-10-CM

## 2013-10-21 DIAGNOSIS — R296 Repeated falls: Secondary | ICD-10-CM

## 2013-10-21 DIAGNOSIS — Z9181 History of falling: Secondary | ICD-10-CM | POA: Diagnosis not present

## 2013-10-21 DIAGNOSIS — M48061 Spinal stenosis, lumbar region without neurogenic claudication: Secondary | ICD-10-CM

## 2013-10-21 DIAGNOSIS — J383 Other diseases of vocal cords: Secondary | ICD-10-CM

## 2013-10-21 DIAGNOSIS — R609 Edema, unspecified: Secondary | ICD-10-CM

## 2013-10-21 DIAGNOSIS — G250 Essential tremor: Secondary | ICD-10-CM

## 2013-10-21 DIAGNOSIS — I82409 Acute embolism and thrombosis of unspecified deep veins of unspecified lower extremity: Secondary | ICD-10-CM | POA: Diagnosis not present

## 2013-10-21 DIAGNOSIS — G252 Other specified forms of tremor: Secondary | ICD-10-CM

## 2013-10-21 LAB — CBC AND DIFFERENTIAL
HCT: 30 % — AB (ref 36–46)
HEMOGLOBIN: 8.8 g/dL — AB (ref 12.0–16.0)
Platelets: 278 10*3/uL (ref 150–399)
WBC: 5.3 10*3/mL

## 2013-10-21 LAB — BASIC METABOLIC PANEL
BUN: 14 mg/dL (ref 4–21)
Glucose: 85 mg/dL
Potassium: 4.2 mmol/L (ref 3.4–5.3)
SODIUM: 138 mmol/L (ref 137–147)

## 2013-10-21 NOTE — Progress Notes (Signed)
Patient ID: Heather [name], female   DOB: 1929-10-06, 80 y.o.   MRN: [id number]   this is a discharge note.  Level of care skilled.  Facility AF.   Chief Complaint   Patient presents with   .   discharge note  HPI: Patient is 80 y.o. female who has had multiple falls, with spinal stenosis who is admitted for OT/PT--- per staff she has done quite well would benefit from a rolling walker--she appears to be ambulating reasonably well now -- apparently there've been no recent falls which is encouraging-she will need PT and OT for further strengthening as well as CNA support for help with her activities of daily living she still has some weakness certainly.  She has no complaints today   She will be going home alone again with nursing and therapy support as well as CNA  .  Marland Kitchen  Past Medical History   Diagnosis  Date   .  Hemoptysis    .  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  Raynaud's disease    .  Fibroids  uterine   .  Tremor, essential    .  Osteopenia    .  Pulmonary infiltrates  april 2008     persistent ct infiltrates   .  Dry eyes      and photosensitivity   .  ANA positive  2007     1:640   .  Spastic dysphonia      Botox therapy, Dr Okey Regal   .  Bronchiectasis    .  Hiatal hernia      presentation as chest pain    Past Surgical History   Procedure  Laterality  Date   .  Dilation and curettage, diagnostic / therapeutic     .  Colonoscopy   2005     negative   .  Cataract surgery       bilaterally      Medication List                              cycloSPORINE 0.05 % ophthalmic emulsion    Commonly known as: RESTASIS    Place 1 drop into both eyes every 12 (twelve) hours.              hydroxypropyl methylcellulose 2.5 % ophthalmic solution    Commonly known as: ISOPTO TEARS    Place 2 drops into both eyes 4 (four) times daily as needed for dry eyes.    multivitamin with minerals tablet    Take 1 tablet by mouth daily.    naproxen  sodium 220 MG tablet    Commonly known as: ANAPROX    Take 220 mg by mouth 2 (two) times daily as needed (pain).    oxyCODONE 5 MG immediate release tablet    Commonly known as: Oxy IR/ROXICODONE    Take 1 tablet (5 mg total) by mouth every 4 (four) hours as needed for moderate pain.    primidone 250 MG tablet    Commonly known as: MYSOLINE    Take 250 mg by mouth every morning.    propranolol ER 80 MG 24 hr capsule    Commonly known as: INDERAL LA    Take 80 mg by mouth every morning.     No orders of the defined types were placed in this encounter.  Immunization History   Administered  Date(s) Administered   .  Influenza Whole  02/08/2007, 01/16/2008, 12/02/2009   .  Pneumococcal Polysaccharide-23  12/01/2010   .  Td  10/13/2004    History   Substance Use Topics   .  Smoking status:  Former Smoker -- 0.30 packs/day for 18 years     Types:  Cigarettes     Quit date:  03/07/1965   .  Smokeless tobacco:  Never Used      Comment: started smoking at age 26   .  Alcohol Use:  No   Family history is noncontributory  Review of Systems  DATA OBTAINED: from patient  GENERAL: Feels well no fevers, fatigue, appetite changes  SKIN: No itching, rash or wounds  EYES: No eye pain, redness, discharge  EARS: No earache, tinnitus, change in hearing  NOSE: No congestion, drainage or bleeding  MOUTH/THROAT: No mouth or tooth pain  RESPIRATORY: No cough, wheezing, SOB  CARDIAC: No chest pain, palpitations, has some mild lower extremity edema this is greater on the left versus the right --she says this has been fairly chronic since she developed her back issues with the left showing some more edema  than the right GI: No abdominal pain, No N/V/D or constipation, No heartburn or reflux  GU: No dysuria, frequency or urgency,  MUSCULOSKELETAL: No unrelieved bone/joint pain  NEUROLOGIC: No headache, dizziness  PSYCHIATRIC: No overt anxiety or sadness. Sleeps well. No behavior issue.                     Physical Exam  Temperature 97.0 pulse 60 respirations 18 blood pressure 125/65 GENERAL APPEARANCE: Alert, conversant. Appropriately groomed. No acute distress.  SKIN: No diaphoresis rash  HEAD: Normocephalic, atraumatic  EYES: Conjunctiva/lids clear. Pupils round, reactive. EOMs intact.  EARS: . Hearing grossly normal.  NOSE: No deformity or discharge.  MOUTH/THROAT: Lips w/o lesions.  RESPIRATORY: Breathing is even, unlabored. Lung sounds are clear  CARDIOVASCULAR: Heart RRR no murmurs, rubs or gallops. Has what she says is baseline lower extremity edema it is greater on the left versus the right-pedal pulses are intact .  GASTROINTESTINAL: Abdomen is soft, non-tender, not distended w/ normal bowel sounds  GENITOURINARY: Bladder non tender, not distended  MUSCULOSKELETAL: No abnormal joints or musculature--she is ambulatory and does well with a rolling walker  NEUROLOGIC: Cranial nerves 2-12 grossly intact. Moves all extremities no tremor.  PSYCHIATRIC: Mood and affect appropriate to situation, no behavioral issues  Patient Active Problem List    Diagnosis  Date Noted   .  Recurrent falls  09/18/2013   .  Protein-calorie malnutrition, severe  09/12/2013   .  UTI (urinary tract infection)  09/10/2013   .  Sciatica  09/10/2013   .  FTT (failure to thrive) in adult  09/10/2013   .  Unspecified adverse effect of unspecified drug, medicinal and biological substance  08/19/2011   .  Hyperlipidemia  12/01/2010   .  VOCAL CORD DISORDER  04/27/2007   .  HEMOPTYSIS  02/26/2007   .  DISEASE, PULMONARY D/T MYCOBACTERIA  12/22/2006   .  FIBROIDS, UTERUS  06/12/2006   .  TREMOR, ESSENTIAL  06/12/2006   .  RAYNAUD'S DISEASE  06/12/2006   .  OSTEOPENIA  06/12/2006     Labs.  10/14/2013.  WBC 5.3 hemoglobin 9.3 platelets 236.  Sodium 139 potassium 4.1 BUN 15 creatinine 0.8.     Component  Value  Date/Time    WBC  7.0  09/11/2013 0511    RBC  4.42  09/11/2013 0831    RBC  4.08   09/11/2013 0511    HGB  11.0*  09/11/2013 0511    HCT  34.7*  09/11/2013 0511    PLT  311  09/11/2013 0511    MCV  85.0  09/11/2013 0511    LYMPHSABS  0.8  09/10/2013 1253    MONOABS  0.7  09/10/2013 1253    EOSABS  0.1  09/10/2013 1253    BASOSABS  0.0  09/10/2013 1253   CMP    Component  Value  Date/Time    NA  140  09/11/2013 0511    K  3.8  09/11/2013 0511    CL  106  09/11/2013 0511    CO2  23  09/11/2013 0511    GLUCOSE  81  09/11/2013 0511    BUN  17  09/11/2013 0511    CREATININE  0.59  09/11/2013 0511    CALCIUM  8.6  09/11/2013 0511    PROT  7.2  08/15/2011 1643    ALBUMIN  3.4*  08/15/2011 1643    AST  22  08/15/2011 1643    ALT  17  08/15/2011 1643    ALKPHOS  61  08/15/2011 1643    BILITOT  0.3  08/15/2011 1643    GFRNONAA  83*  09/11/2013 0511    GFRAA  >90  09/11/2013 0511    Assessment and plan  Recurrent falls  Patient had evidence of severe spinal stenosis on MRI lumbar spine 12/07,Severe spinal stenosis at L4-5 due to 10 mm of slip  repeated MRI of the sacrum that showed bilateral sacral insufficiency fracture  MRI of the lumbar spine showed,Stable grade 1 anterolisthesis at L4-5 with annular disc bulging  contributing to mildly progressive severe spinal stenosis. Left  greater than right foraminal stenosis appears unchanged. She has progressed well with physical therapy he is receiving oxycodone which apparently she takes fairly frequently also taking Naproxin as needed     UTI (urinary tract infection)  Treated with Rocephin IV x3 days and then completed a course of Keflex  This appears to have resolved clinically  TREMOR, ESSENTIAL  which is well controlled on Inderal LA 80 mg daily and primidone 250 mg b.i.d  Followed by [facility]  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 Huel Coventry   Anemia-. She has somewhat variable hemoglobins ranging from the 9-11 range-she apparently is getting weekly CBCs these will be  followed by her primary care provide note I also see an order for weekly BMPs-r   Edema-bilaterally she says this is not really new but would like to get venous Doppler to make sure we don't have a clot  Again she will be going home and will have home health support including nursing for follow up of her medical issues tremors anemia--and weakness.  Also will benefit from PT and OT for further strengthening as well as CNA support secondary to some continued weakness-she will need a rolling walker secondary to fall risk   Addendum-we have the results of the Doppler ultrasound actually is positive for a DVT of the left lower extremity-at this point will have to hold discharge.  Will start Lovenox as well as Coumadin with bridge to Coumadin only when INR is greater than 2-.  Will give Lovenox 2 times a day and start Coumadin 5 mg a day with an INR to be done in 2  days for followup.  I suspect we will have to have her fairly well anticoagulated before we can send her home-again at this point discharge is being held.  [id number] note more than 40 minutes spent assessing patient-reviewing her medical records-preparing discharge summary and then reevaluating this after DVT was diagnosed.  Clinically I did recheck her she appears to be stable is not really having pain in this leg she will need be on bedrest however for approximately 24 hours.  [id number]

## 2013-10-25 ENCOUNTER — Other Ambulatory Visit: Payer: Self-pay | Admitting: *Deleted

## 2013-10-25 MED ORDER — OXYCODONE HCL 5 MG PO TABS: ORAL_TABLET | ORAL | Status: DC

## 2013-10-25 NOTE — Telephone Encounter (Signed)
[facility] 

## 2013-10-26 DIAGNOSIS — I82409 Acute embolism and thrombosis of unspecified deep veins of unspecified lower extremity: Secondary | ICD-10-CM | POA: Insufficient documentation

## 2013-10-26 NOTE — Progress Notes (Signed)
Patient ID: Heather [name], female   DOB: 01/23/1930, 80 y.o.   MRN: [id number]   this is an acute visit Level of care skilled.  Facility AF.  Chief Complaint   Patient presents with   .   acute visit secondary to left leg DVT-followup of her medical issues for what was scheduled as a discharge   HPI: Patient is 80 y.o. female who has had multiple falls, with spinal stenosis who was admitted for OT/PT--- per staff she has done quite well would benefit from a rolling walker--she appears to be ambulating reasonably well now -- apparently there've been no recent falls which is encouraging-she will need PT and OT for further strengthening as well as CNA support for help with her activities of daily living she still has some weakness certainly.  She has no complaints today  She was going home alone again with nursing and therapy support as well as CNA --however when I evaluated her earlier today I noted her left leg and had some increased edema-the venous Doppler was ordered which has come back positive for a DVT .  Marland Kitchen  Past Medical History   Diagnosis  Date   .  Hemoptysis    .  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  Raynaud's disease    .  Fibroids  uterine   .  Tremor, essential    .  Osteopenia    .  Pulmonary infiltrates  april 2008     persistent ct infiltrates   .  Dry eyes      and photosensitivity   .  ANA positive  2007     1:640   .  Spastic dysphonia      Botox therapy, Dr Okey Regal   .  Bronchiectasis    .  Hiatal hernia      presentation as chest pain    Past Surgical History   Procedure  Laterality  Date   .  Dilation and curettage, diagnostic / therapeutic     .  Colonoscopy   2005     negative   .  Cataract surgery       bilaterally      Medication List                             cycloSPORINE 0.05 % ophthalmic emulsion    Commonly known as: RESTASIS    Place 1 drop into both eyes every 12 (twelve) hours.             hydroxypropyl  methylcellulose 2.5 % ophthalmic solution    Commonly known as: ISOPTO TEARS    Place 2 drops into both eyes 4 (four) times daily as needed for dry eyes.    multivitamin with minerals tablet    Take 1 tablet by mouth daily.    naproxen sodium 220 MG tablet    Commonly known as: ANAPROX    Take 220 mg by mouth 2 (two) times daily as needed (pain).    oxyCODONE 5 MG immediate release tablet    Commonly known as: Oxy IR/ROXICODONE    Take 1 tablet (5 mg total) by mouth every 4 (four) hours as needed for moderate pain.    primidone 250 MG tablet    Commonly known as: MYSOLINE    Take 250 mg by mouth every morning.    propranolol ER 80 MG 24 hr capsule  Commonly known as: INDERAL LA    Take 80 mg by mouth every morning.     No orders of the defined types were placed in this encounter.  Immunization History   Administered  Date(s) Administered   .  Influenza Whole  02/08/2007, 01/16/2008, 12/02/2009   .  Pneumococcal Polysaccharide-23  12/01/2010   .  Td  10/13/2004    History   Substance Use Topics   .  Smoking status:  Former Smoker -- 0.30 packs/day for 18 years     Types:  Cigarettes     Quit date:  03/07/1965   .  Smokeless tobacco:  Never Used      Comment: started smoking at age 54   .  Alcohol Use:  No   Family history is noncontributory  Review of Systems  DATA OBTAINED: from patient  GENERAL: Feels well no fevers, fatigue, appetite changes  SKIN: No itching, rash or wounds  EYES: No eye pain, redness, discharge  EARS: No earache, tinnitus, change in hearing  NOSE: No congestion, drainage or bleeding  MOUTH/THROAT: No mouth or tooth pain  RESPIRATORY: No cough, wheezing, SOB  CARDIAC: No chest pain, palpitations, has some mild lower extremity edema this is greater on the left versus the right --she says this has been fairly chronic since she developed her back issues with the left showing some more edema than the right --of note this has been diagnosed as a DVT via  Doppler GI: No abdominal pain, No N/V/D or constipation, No heartburn or reflux  GU: No dysuria, frequency or urgency,  MUSCULOSKELETAL: No unrelieved bone/joint pain  NEUROLOGIC: No headache, dizziness  PSYCHIATRIC: No overt anxiety or sadness. Sleeps well. No behavior issue.                   Physical Exam  Temperature 97.0 pulse 60 respirations 18 blood pressure 125/65  GENERAL APPEARANCE: Alert, conversant. Appropriately groomed. No acute distress.  SKIN: No diaphoresis rash  HEAD: Normocephalic, atraumatic  EYES: Conjunctiva/lids clear. Pupils round, reactive. EOMs intact.  EARS: . Hearing grossly normal.  NOSE: No deformity or discharge.  MOUTH/THROAT: Lips w/o lesions.  RESPIRATORY: Breathing is even, unlabored. Lung sounds are clear  CARDIOVASCULAR: Heart RRR no murmurs, rubs or gallops. Has what she says is baseline lower extremity edema it is greater on the left versus the right-pedal pulses are intact--somewhat reduced on the .  GASTROINTESTINAL: Abdomen is soft, non-tender, not distended w/ normal bowel sounds  GENITOURINARY: Bladder non tender, not distended  MUSCULOSKELETAL: No abnormal joints or musculature--she is ambulatory and does well with a rolling walker  NEUROLOGIC: Cranial nerves 2-12 grossly intact. Moves all extremities no tremor.  PSYCHIATRIC: Mood and affect appropriate to situation, no behavioral issues  Patient Active Problem List    Diagnosis  Date Noted   .  Recurrent falls  09/18/2013   .  Protein-calorie malnutrition, severe  09/12/2013   .  UTI (urinary tract infection)  09/10/2013   .  Sciatica  09/10/2013   .  FTT (failure to thrive) in adult  09/10/2013   .  Unspecified adverse effect of unspecified drug, medicinal and biological substance  08/19/2011   .  Hyperlipidemia  12/01/2010   .  VOCAL CORD DISORDER  04/27/2007   .  HEMOPTYSIS  02/26/2007   .  DISEASE, PULMONARY D/T MYCOBACTERIA  12/22/2006   .  FIBROIDS, UTERUS  06/12/2006   .   TREMOR, ESSENTIAL  06/12/2006   .  RAYNAUD'S DISEASE  06/12/2006   .  OSTEOPENIA  06/12/2006   Labs.  10/14/2013.  WBC 5.3 hemoglobin 9.3 platelets 236.  Sodium 139 potassium 4.1 BUN 15 creatinine 0.8.    Component  Value  Date/Time    WBC  7.0  09/11/2013 0511    RBC  4.42  09/11/2013 0831    RBC  4.08  09/11/2013 0511    HGB  11.0*  09/11/2013 0511    HCT  34.7*  09/11/2013 0511    PLT  311  09/11/2013 0511    MCV  85.0  09/11/2013 0511    LYMPHSABS  0.8  09/10/2013 1253    MONOABS  0.7  09/10/2013 1253    EOSABS  0.1  09/10/2013 1253    BASOSABS  0.0  09/10/2013 1253   CMP    Component  Value  Date/Time    NA  140  09/11/2013 0511    K  3.8  09/11/2013 0511    CL  106  09/11/2013 0511    CO2  23  09/11/2013 0511    GLUCOSE  81  09/11/2013 0511    BUN  17  09/11/2013 0511    CREATININE  0.59  09/11/2013 0511    CALCIUM  8.6  09/11/2013 0511    PROT  7.2  08/15/2011 1643    ALBUMIN  3.4*  08/15/2011 1643    AST  22  08/15/2011 1643    ALT  17  08/15/2011 1643    ALKPHOS  61  08/15/2011 1643    BILITOT  0.3  08/15/2011 1643    GFRNONAA  83*  09/11/2013 0511    GFRAA  >90  09/11/2013 0511   Assessment and plan  Recurrent falls  Patient had evidence of severe spinal stenosis on MRI lumbar spine 12/07,Severe spinal stenosis at L4-5 due to 10 mm of slip repeated MRI of the sacrum that showed bilateral sacral insufficiency fracture  MRI of the lumbar spine showed,Stable grade 1 anterolisthesis at L4-5 with annular disc bulging  contributing to mildly progressive severe spinal stenosis. Left  greater than right foraminal stenosis appears unchanged.  She has progressed well with physical therapy he is receiving oxycodone which apparently she takes fairly frequently also taking Naproxin as needed  UTI (urinary tract infection)  Treated with Rocephin IV x3 days and then completed a course of Keflex  This appears to have resolved clinically  TREMOR, ESSENTIAL  which is well controlled on Inderal LA 80 mg daily and primidone  250 mg b.i.d  Followed by [facility]  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 Huel Coventry  Anemia-. She has somewhat variable hemoglobins ranging from the 9-11 range-she apparently is getting weekly CBCs these will be followed by her primary care provide note I also see an order for weekly BMPs-r  Edema-b--as noted this was diagnosed as a DVT on the left Again she was going home and was going to  have home health support including nursing for follow up of her medical issues tremors anemia--and weakness.--However diagnosis of a DVT complicates matters-at this point we will start Lovenox twice a day as well as Coumadin-she will need a PT/INR couple of days --also will need to be on bedrest for 24 hours.  Obviously her discharge was held today     [id number] note more than 40 minutes spent assessing patient-reviewing her medical records-preparing her for discharge and then  reevaluating this after DVT was diagnosed.  Clinically I did recheck her she appears to be stable is not really having pain in this leg she will need be on bedrest however for approximately 24 hours.

## 2013-10-29 ENCOUNTER — Telehealth: Payer: Self-pay | Admitting: Internal Medicine

## 2013-10-29 ENCOUNTER — Ambulatory Visit: Payer: Medicare Other | Admitting: Internal Medicine

## 2013-10-29 DIAGNOSIS — R4689 Other symptoms and signs involving appearance and behavior: Secondary | ICD-10-CM | POA: Insufficient documentation

## 2013-10-29 NOTE — Telephone Encounter (Signed)
Patient no showed for acute 8/25.  Please advise.

## 2013-10-29 NOTE — Telephone Encounter (Signed)
Don't charge. 

## 2013-10-31 NOTE — Progress Notes (Signed)
This encounter was created in error - please disregard.

## 2013-10-31 NOTE — Addendum Note (Signed)
Addended by: Wille Celeste on: 10/31/2013 12:31 PM   Modules accepted: Level of Service, SmartSet

## 2013-11-08 ENCOUNTER — Encounter: Payer: Self-pay | Admitting: Internal Medicine

## 2013-11-08 ENCOUNTER — Non-Acute Institutional Stay (SKILLED_NURSING_FACILITY): Payer: Medicare Other | Admitting: Internal Medicine

## 2013-11-08 DIAGNOSIS — J383 Other diseases of vocal cords: Secondary | ICD-10-CM | POA: Diagnosis not present

## 2013-11-08 DIAGNOSIS — R296 Repeated falls: Secondary | ICD-10-CM

## 2013-11-08 DIAGNOSIS — IMO0002 Reserved for concepts with insufficient information to code with codable children: Secondary | ICD-10-CM

## 2013-11-08 DIAGNOSIS — R627 Adult failure to thrive: Secondary | ICD-10-CM | POA: Diagnosis not present

## 2013-11-08 DIAGNOSIS — R4689 Other symptoms and signs involving appearance and behavior: Secondary | ICD-10-CM

## 2013-11-08 DIAGNOSIS — I82402 Acute embolism and thrombosis of unspecified deep veins of left lower extremity: Secondary | ICD-10-CM

## 2013-11-08 DIAGNOSIS — G250 Essential tremor: Secondary | ICD-10-CM

## 2013-11-08 DIAGNOSIS — I82409 Acute embolism and thrombosis of unspecified deep veins of unspecified lower extremity: Secondary | ICD-10-CM

## 2013-11-08 DIAGNOSIS — E43 Unspecified severe protein-calorie malnutrition: Secondary | ICD-10-CM

## 2013-11-08 DIAGNOSIS — M5430 Sciatica, unspecified side: Secondary | ICD-10-CM

## 2013-11-08 DIAGNOSIS — Z9181 History of falling: Secondary | ICD-10-CM

## 2013-11-08 DIAGNOSIS — G252 Other specified forms of tremor: Secondary | ICD-10-CM

## 2013-11-08 NOTE — Progress Notes (Signed)
MRN: [id number] Name: Heather [name]  Sex: female Age: 80 y.o. DOB: Jun 18, 1929  [place] #: [facility] Facility/Room:513 Level Of Care: SNF Provider: Inocencio Homes D Emergency Contacts: Extended Emergency Contact Information Primary Emergency Contact: Olevia Bowens [place] Phone: [phone] Mobile Phone: [phone] Relation: Relative Secondary Emergency Contact: Shirley Friar, [place] Phone: [phone] Relation: Friend  Code Status: DNR  Allergies: Ciprofloxacin and Phenazopyridine hcl  Chief Complaint  Patient presents with  . Discharge Note    HPI: Patient is 80 y.o. female who was admitted for generalized weakness and who was dx with DVT just prior to being discharged last month. Pt is now wanting discharge to home.  Past Medical History  Diagnosis Date  . Hemoptysis   . 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 Raynaud's disease   . Fibroids uterine  . Tremor, essential   . Osteopenia   . Pulmonary infiltrates april 2008    persistent ct infiltrates  . Dry eyes     and photosensitivity  . ANA positive 2007    1:640  . Spastic dysphonia     Botox therapy, Dr Okey Regal  . Bronchiectasis   . Hiatal hernia     presentation as chest pain    Past Surgical History  Procedure Laterality Date  . Dilation and curettage, diagnostic / therapeutic    . Colonoscopy  2005    negative  . Cataract surgery      bilaterally      Medication List       This list is accurate as of: 11/08/13  1:36 PM.  Always use your most recent med list.               cycloSPORINE 0.05 % ophthalmic emulsion  Commonly known as:  RESTASIS  Place 1 drop into both eyes every 12 (twelve) hours.     enoxaparin 80 MG/0.8ML injection  Commonly known as:  LOVENOX  Inject 80 mg into the skin daily.     enoxaparin 40 MG/0.4ML injection  Commonly known as:  LOVENOX  Inject 0.4 mLs (40 mg total) into the skin  daily.     hydroxypropyl methylcellulose / hypromellose 2.5 % ophthalmic solution  Commonly known as:  ISOPTO TEARS / GONIOVISC  Place 2 drops into both eyes 4 (four) times daily as needed for dry eyes.     multivitamin with minerals tablet  Take 1 tablet by mouth daily.     oxyCODONE 5 MG immediate release tablet  Commonly known as:  Oxy IR/ROXICODONE  Take one tablet by mouth every four hours as needed for pain     primidone 250 MG tablet  Commonly known as:  MYSOLINE  Take 250 mg by mouth every morning.     propranolol ER 80 MG 24 hr capsule  Commonly known as:  INDERAL LA  Take 80 mg by mouth every morning.        Meds ordered this encounter  Medications  . enoxaparin (LOVENOX) 80 MG/0.8ML injection    Sig: Inject 80 mg into the skin daily.    Immunization History  Administered Date(s) Administered  . Influenza Whole 02/08/2007, 01/16/2008, 12/02/2009  . Pneumococcal Polysaccharide-23 12/01/2010  . Td 10/13/2004    History  Substance Use Topics  . Smoking status: Former Smoker -- 0.30 packs/day for 18 years    Types: Cigarettes    Quit date: 03/07/1965  .  Smokeless tobacco: Never Used     Comment: started smoking at age 21  . Alcohol Use: No    Filed Vitals:   11/08/13 1247  BP: 102/62  Pulse: 70  Temp: 98.1 F (36.7 C)  Resp: 20    Physical Exam  GENERAL APPEARANCE: Alert, min conversant. Appropriately groomed. No acute distress.  HEENT: Unremarkable. RESPIRATORY: Breathing is even, unlabored. Lung sounds are clear   CARDIOVASCULAR: Heart RRR no murmurs, rubs or gallops. No peripheral edema.  GASTROINTESTINAL: Abdomen is soft, non-tender, not distended w/ normal bowel sounds.  NEUROLOGIC: Cranial nerves 2-12 grossly intact  Patient Active Problem List   Diagnosis Date Noted  . Non-compliant behavior 10/29/2013  . DVT (deep venous thrombosis) 10/26/2013  . Recurrent falls 09/18/2013  . Protein-calorie malnutrition, severe 09/12/2013  . UTI  (urinary tract infection) 09/10/2013  . Sciatica 09/10/2013  . FTT (failure to thrive) in adult 09/10/2013  . Unspecified adverse effect of unspecified drug, medicinal and biological substance 08/19/2011  . Hyperlipidemia 12/01/2010  . VOCAL CORD DISORDER 04/27/2007  . HEMOPTYSIS 02/26/2007  . DISEASE, PULMONARY D/T MYCOBACTERIA 12/22/2006  . FIBROIDS, UTERUS 06/12/2006  . TREMOR, ESSENTIAL 06/12/2006  . RAYNAUD'S DISEASE 06/12/2006  . OSTEOPENIA 06/12/2006    CBC    Component Value Date/Time   WBC 5.3 10/21/2013   WBC 7.0 09/11/2013 0511   RBC 4.42 09/11/2013 0831   RBC 4.08 09/11/2013 0511   HGB 8.8* 10/21/2013   HCT 30* 10/21/2013   PLT 278 10/21/2013   MCV 85.0 09/11/2013 0511   LYMPHSABS 0.8 09/10/2013 1253   MONOABS 0.7 09/10/2013 1253   EOSABS 0.1 09/10/2013 1253   BASOSABS 0.0 09/10/2013 1253    CMP     Component Value Date/Time   NA 138 10/21/2013   NA 140 09/11/2013 0511   K 4.2 10/21/2013   CL 106 09/11/2013 0511   CO2 23 09/11/2013 0511   GLUCOSE 81 09/11/2013 0511   BUN 14 10/21/2013   BUN 17 09/11/2013 0511   CREATININE 0.59 09/11/2013 0511   CALCIUM 8.6 09/11/2013 0511   PROT 7.2 08/15/2011 1643   ALBUMIN 3.4* 08/15/2011 1643   AST 22 08/15/2011 1643   ALT 17 08/15/2011 1643   ALKPHOS 61 08/15/2011 1643   BILITOT 0.3 08/15/2011 1643   GFRNONAA 83* 09/11/2013 0511   GFRAA >90 09/11/2013 0511    Assessment and Plan  Pt will be discharged to home with OT/PT/HH and nursing. Nursing will administer her Lovenox which has been changed to 80 mg once daily. Pt will need an INR/PT on Tues 9/8. Her coumadin is still not theraputic and it should be followed by her PCP.  Hennie Duos, MD

## 2013-11-09 ENCOUNTER — Encounter: Payer: Self-pay | Admitting: Internal Medicine

## 2013-11-12 DIAGNOSIS — I82409 Acute embolism and thrombosis of unspecified deep veins of unspecified lower extremity: Secondary | ICD-10-CM | POA: Diagnosis not present

## 2013-11-12 DIAGNOSIS — I7300 Raynaud's syndrome without gangrene: Secondary | ICD-10-CM

## 2013-11-12 DIAGNOSIS — R5381 Other malaise: Secondary | ICD-10-CM

## 2013-11-12 DIAGNOSIS — G250 Essential tremor: Secondary | ICD-10-CM | POA: Diagnosis not present

## 2013-11-12 DIAGNOSIS — G252 Other specified forms of tremor: Secondary | ICD-10-CM

## 2013-11-12 DIAGNOSIS — R5383 Other fatigue: Secondary | ICD-10-CM

## 2013-11-12 DIAGNOSIS — M5430 Sciatica, unspecified side: Secondary | ICD-10-CM | POA: Diagnosis not present

## 2013-11-12 DIAGNOSIS — M1990 Unspecified osteoarthritis, unspecified site: Secondary | ICD-10-CM | POA: Diagnosis not present

## 2013-11-13 ENCOUNTER — Telehealth: Payer: Self-pay | Admitting: *Deleted

## 2013-11-13 ENCOUNTER — Telehealth: Payer: Self-pay

## 2013-11-13 DIAGNOSIS — G250 Essential tremor: Secondary | ICD-10-CM | POA: Diagnosis not present

## 2013-11-13 DIAGNOSIS — M5430 Sciatica, unspecified side: Secondary | ICD-10-CM | POA: Diagnosis not present

## 2013-11-13 DIAGNOSIS — R5381 Other malaise: Secondary | ICD-10-CM | POA: Diagnosis not present

## 2013-11-13 DIAGNOSIS — I82409 Acute embolism and thrombosis of unspecified deep veins of unspecified lower extremity: Secondary | ICD-10-CM | POA: Diagnosis not present

## 2013-11-13 DIAGNOSIS — G252 Other specified forms of tremor: Secondary | ICD-10-CM | POA: Diagnosis not present

## 2013-11-13 DIAGNOSIS — M1990 Unspecified osteoarthritis, unspecified site: Secondary | ICD-10-CM | POA: Diagnosis not present

## 2013-11-13 DIAGNOSIS — I7300 Raynaud's syndrome without gangrene: Secondary | ICD-10-CM | POA: Diagnosis not present

## 2013-11-13 DIAGNOSIS — R5383 Other fatigue: Secondary | ICD-10-CM | POA: Diagnosis not present

## 2013-11-13 NOTE — Telephone Encounter (Signed)
Heather [name] with [facility] called with INR  1.2 Results. Patient has been discharged from facility and is Dr. Clayborn Heron patient. Tried calling HomeHealth back regarding and had to leave message to return call.

## 2013-11-13 NOTE — Telephone Encounter (Signed)
Heather [name] notified via voicemail

## 2013-11-13 NOTE — Telephone Encounter (Signed)
Phone call from [place] with [facility] [phone], states patient's PT is 14.7 and INR is 1.2. 7 mg of coumadin daily. Patient did not start Lovenox 85 mg until last night.

## 2013-11-13 NOTE — Telephone Encounter (Signed)
   They need to discuss this with the prescribing physician. She had an appointment here 10/29/13 but did not show for the appointment or call and cancel it.  I have not seen the patient since 08/19/11. The chart indicates that she has been followed by the geriatric medicine service.

## 2013-11-13 NOTE — Telephone Encounter (Signed)
Olivia Mackie Notified and will contact correct provider.

## 2013-11-14 DIAGNOSIS — R5381 Other malaise: Secondary | ICD-10-CM | POA: Diagnosis not present

## 2013-11-14 DIAGNOSIS — R5383 Other fatigue: Secondary | ICD-10-CM | POA: Diagnosis not present

## 2013-11-14 DIAGNOSIS — G250 Essential tremor: Secondary | ICD-10-CM | POA: Diagnosis not present

## 2013-11-14 DIAGNOSIS — M1990 Unspecified osteoarthritis, unspecified site: Secondary | ICD-10-CM | POA: Diagnosis not present

## 2013-11-14 DIAGNOSIS — G252 Other specified forms of tremor: Secondary | ICD-10-CM | POA: Diagnosis not present

## 2013-11-14 DIAGNOSIS — I82409 Acute embolism and thrombosis of unspecified deep veins of unspecified lower extremity: Secondary | ICD-10-CM | POA: Diagnosis not present

## 2013-11-14 DIAGNOSIS — M5430 Sciatica, unspecified side: Secondary | ICD-10-CM | POA: Diagnosis not present

## 2013-11-14 DIAGNOSIS — I7300 Raynaud's syndrome without gangrene: Secondary | ICD-10-CM | POA: Diagnosis not present

## 2013-11-15 ENCOUNTER — Ambulatory Visit (INDEPENDENT_AMBULATORY_CARE_PROVIDER_SITE_OTHER): Payer: Medicare Other | Admitting: *Deleted

## 2013-11-15 ENCOUNTER — Ambulatory Visit: Payer: Medicare Other | Admitting: *Deleted

## 2013-11-15 DIAGNOSIS — Z5181 Encounter for therapeutic drug level monitoring: Secondary | ICD-10-CM

## 2013-11-15 LAB — POCT INR: INR: 1.2

## 2013-11-18 ENCOUNTER — Telehealth: Payer: Self-pay

## 2013-11-18 ENCOUNTER — Ambulatory Visit: Payer: Medicare Other | Admitting: Internal Medicine

## 2013-11-18 DIAGNOSIS — Z0289 Encounter for other administrative examinations: Secondary | ICD-10-CM

## 2013-11-18 NOTE — Telephone Encounter (Signed)
Phone call from [place] with [facility] seeing if Dr Linna Darner will sign orders for patient. I let Olivia Mackie know patient did not show for her appointment today. As per a previous message from Dr Linna Darner he will not sign orders since patient has not been in to see him recently.

## 2013-11-19 ENCOUNTER — Encounter: Payer: Self-pay | Admitting: Internal Medicine

## 2013-11-19 DIAGNOSIS — M1990 Unspecified osteoarthritis, unspecified site: Secondary | ICD-10-CM | POA: Diagnosis not present

## 2013-11-19 DIAGNOSIS — R5383 Other fatigue: Secondary | ICD-10-CM | POA: Diagnosis not present

## 2013-11-19 DIAGNOSIS — G250 Essential tremor: Secondary | ICD-10-CM | POA: Diagnosis not present

## 2013-11-19 DIAGNOSIS — I7300 Raynaud's syndrome without gangrene: Secondary | ICD-10-CM | POA: Diagnosis not present

## 2013-11-19 DIAGNOSIS — R5381 Other malaise: Secondary | ICD-10-CM | POA: Diagnosis not present

## 2013-11-19 DIAGNOSIS — I82409 Acute embolism and thrombosis of unspecified deep veins of unspecified lower extremity: Secondary | ICD-10-CM | POA: Diagnosis not present

## 2013-11-19 DIAGNOSIS — M5430 Sciatica, unspecified side: Secondary | ICD-10-CM | POA: Diagnosis not present

## 2013-11-25 ENCOUNTER — Ambulatory Visit: Payer: Medicare Other | Admitting: Internal Medicine

## 2013-11-28 ENCOUNTER — Ambulatory Visit: Payer: Medicare Other | Admitting: Internal Medicine

## 2013-12-10 ENCOUNTER — Telehealth: Payer: Self-pay | Admitting: Nurse Practitioner

## 2013-12-10 ENCOUNTER — Ambulatory Visit: Payer: Medicare Other | Admitting: Nurse Practitioner

## 2013-12-10 NOTE — Telephone Encounter (Signed)
Patient was no show for today's office appointment.  

## 2013-12-11 ENCOUNTER — Telehealth: Payer: Self-pay

## 2013-12-11 NOTE — Telephone Encounter (Signed)
Thank you Hopp.

## 2013-12-11 NOTE — Telephone Encounter (Signed)
Patient Information:  Caller Name: Corryn  Phone: [phone]  Patient: Heather [name]  Gender: Female  DOB: June 17, 1929  Age: 80 Years  PCP: Unice Cobble  Office Follow Up:  Does the office need to follow up with this patient?: No  Instructions For The Office: N/A  RN Note:  Gaye, RN agrees to instructing Lattie Haw, caller, to proceed to ED with patient for evaluation.  Symptoms  Reason For Call & Symptoms: Primary Concern is left toe is  black and cold. Also concerned by confusion and mood swings.  Reviewed Health History In EMR: Yes  Reviewed Medications In EMR: Yes  Reviewed Allergies In EMR: Yes  Reviewed Surgeries / Procedures: Yes  Date of Onset of Symptoms: 12/10/2013  Guideline(s) Used:  Foot Pain  Disposition Per Guideline:   Go to ED Now  Reason For Disposition Reached:   Purple or black skin on foot or toe  Advice Given:  N/A  Patient Will Follow Care Advice:  YES

## 2013-12-11 NOTE — Telephone Encounter (Signed)
[facility] called with disposition for patient to be sent to ED due to great toe turning black. Advised ok to send to ED and advised that patient is seen by Dr Linna Darner at [place] office.  Sent notation to PCP

## 2013-12-11 NOTE — Telephone Encounter (Signed)
Heather [name] , No Show X 2  Followed by Geriatric service  Please take my name off as PCP ; not seen several years

## 2014-01-03 ENCOUNTER — Telehealth: Payer: Self-pay | Admitting: Internal Medicine

## 2014-01-03 NOTE — Telephone Encounter (Signed)
Pt is not a pt of Dr. Jenny Reichmann. Use to see Dr. Linna Darner in the past but haven't seen him since 2013. Pt need appt for evaluation for antibiotic...Heather [name]

## 2014-01-03 NOTE — Telephone Encounter (Signed)
Heather [name] with [facility] called in and Dr Jenny Reichmann was to suppose to call in antibiotic earlier in the week. They still don't have the meds per [facility] number [phone]

## 2014-01-09 ENCOUNTER — Ambulatory Visit: Payer: Medicare Other | Admitting: Family

## 2014-01-22 ENCOUNTER — Ambulatory Visit: Payer: Medicare Other | Admitting: Family

## 2014-06-05 ENCOUNTER — Other Ambulatory Visit: Payer: Self-pay | Admitting: Internal Medicine

## 2015-11-20 ENCOUNTER — Encounter (HOSPITAL_COMMUNITY): Payer: Self-pay | Admitting: Emergency Medicine

## 2015-11-20 ENCOUNTER — Inpatient Hospital Stay (HOSPITAL_COMMUNITY)
Admission: EM | Admit: 2015-11-20 | Discharge: 2015-11-27 | DRG: 871 | Disposition: A | Discharge status: Skilled Nursing Facility | Payer: Medicare (Managed Care) | Attending: Internal Medicine | Admitting: Internal Medicine

## 2015-11-20 ENCOUNTER — Emergency Department (HOSPITAL_COMMUNITY): Payer: Medicare (Managed Care)

## 2015-11-20 ENCOUNTER — Inpatient Hospital Stay (HOSPITAL_COMMUNITY): Payer: Medicare (Managed Care)

## 2015-11-20 DIAGNOSIS — R079 Chest pain, unspecified: Secondary | ICD-10-CM | POA: Diagnosis not present

## 2015-11-20 DIAGNOSIS — R058 Other specified cough: Secondary | ICD-10-CM | POA: Diagnosis present

## 2015-11-20 DIAGNOSIS — R627 Adult failure to thrive: Secondary | ICD-10-CM | POA: Diagnosis present

## 2015-11-20 DIAGNOSIS — R296 Repeated falls: Secondary | ICD-10-CM | POA: Diagnosis present

## 2015-11-20 DIAGNOSIS — J189 Pneumonia, unspecified organism: Secondary | ICD-10-CM | POA: Diagnosis present

## 2015-11-20 DIAGNOSIS — Z8249 Family history of ischemic heart disease and other diseases of the circulatory system: Secondary | ICD-10-CM

## 2015-11-20 DIAGNOSIS — R262 Difficulty in walking, not elsewhere classified: Secondary | ICD-10-CM

## 2015-11-20 DIAGNOSIS — Z9119 Patient's noncompliance with other medical treatment and regimen: Secondary | ICD-10-CM

## 2015-11-20 DIAGNOSIS — J470 Bronchiectasis with acute lower respiratory infection: Secondary | ICD-10-CM | POA: Diagnosis present

## 2015-11-20 DIAGNOSIS — D72829 Elevated white blood cell count, unspecified: Secondary | ICD-10-CM | POA: Diagnosis present

## 2015-11-20 DIAGNOSIS — I7300 Raynaud's syndrome without gangrene: Secondary | ICD-10-CM | POA: Diagnosis present

## 2015-11-20 DIAGNOSIS — Z823 Family history of stroke: Secondary | ICD-10-CM | POA: Diagnosis not present

## 2015-11-20 DIAGNOSIS — E872 Acidosis: Secondary | ICD-10-CM | POA: Diagnosis present

## 2015-11-20 DIAGNOSIS — A419 Sepsis, unspecified organism: Principal | ICD-10-CM

## 2015-11-20 DIAGNOSIS — I480 Paroxysmal atrial fibrillation: Secondary | ICD-10-CM | POA: Diagnosis present

## 2015-11-20 DIAGNOSIS — Z87891 Personal history of nicotine dependence: Secondary | ICD-10-CM

## 2015-11-20 DIAGNOSIS — Z681 Body mass index (BMI) 19 or less, adult: Secondary | ICD-10-CM

## 2015-11-20 DIAGNOSIS — R05 Cough: Secondary | ICD-10-CM | POA: Diagnosis present

## 2015-11-20 DIAGNOSIS — I5032 Chronic diastolic (congestive) heart failure: Secondary | ICD-10-CM | POA: Diagnosis present

## 2015-11-20 DIAGNOSIS — E43 Unspecified severe protein-calorie malnutrition: Secondary | ICD-10-CM | POA: Diagnosis present

## 2015-11-20 DIAGNOSIS — E871 Hypo-osmolality and hyponatremia: Secondary | ICD-10-CM | POA: Diagnosis present

## 2015-11-20 DIAGNOSIS — R2689 Other abnormalities of gait and mobility: Secondary | ICD-10-CM

## 2015-11-20 DIAGNOSIS — R7989 Other specified abnormal findings of blood chemistry: Secondary | ICD-10-CM

## 2015-11-20 DIAGNOSIS — R0602 Shortness of breath: Secondary | ICD-10-CM | POA: Diagnosis not present

## 2015-11-20 DIAGNOSIS — R6520 Severe sepsis without septic shock: Secondary | ICD-10-CM | POA: Diagnosis present

## 2015-11-20 DIAGNOSIS — R531 Weakness: Secondary | ICD-10-CM

## 2015-11-20 DIAGNOSIS — A310 Pulmonary mycobacterial infection: Secondary | ICD-10-CM | POA: Diagnosis present

## 2015-11-20 DIAGNOSIS — Z66 Do not resuscitate: Secondary | ICD-10-CM | POA: Diagnosis present

## 2015-11-20 DIAGNOSIS — I5033 Acute on chronic diastolic (congestive) heart failure: Secondary | ICD-10-CM | POA: Diagnosis present

## 2015-11-20 DIAGNOSIS — J479 Bronchiectasis, uncomplicated: Secondary | ICD-10-CM

## 2015-11-20 DIAGNOSIS — I471 Supraventricular tachycardia: Secondary | ICD-10-CM | POA: Diagnosis present

## 2015-11-20 DIAGNOSIS — R4689 Other symptoms and signs involving appearance and behavior: Secondary | ICD-10-CM | POA: Diagnosis present

## 2015-11-20 DIAGNOSIS — J383 Other diseases of vocal cords: Secondary | ICD-10-CM | POA: Diagnosis present

## 2015-11-20 DIAGNOSIS — E860 Dehydration: Secondary | ICD-10-CM | POA: Diagnosis present

## 2015-11-20 DIAGNOSIS — R740 Nonspecific elevation of levels of transaminase and lactic acid dehydrogenase [LDH]: Secondary | ICD-10-CM | POA: Diagnosis present

## 2015-11-20 DIAGNOSIS — I509 Heart failure, unspecified: Secondary | ICD-10-CM

## 2015-11-20 LAB — COMPREHENSIVE METABOLIC PANEL
ALT: 18 U/L (ref 14–54)
AST: 44 U/L — ABNORMAL HIGH (ref 15–41)
Albumin: 3.1 g/dL — ABNORMAL LOW (ref 3.5–5.0)
Alkaline Phosphatase: 96 U/L (ref 38–126)
Anion gap: 13 (ref 5–15)
BILIRUBIN TOTAL: 1 mg/dL (ref 0.3–1.2)
BUN: 21 mg/dL — ABNORMAL HIGH (ref 6–20)
CHLORIDE: 97 mmol/L — AB (ref 101–111)
CO2: 21 mmol/L — ABNORMAL LOW (ref 22–32)
Calcium: 8.9 mg/dL (ref 8.9–10.3)
Creatinine, Ser: 0.91 mg/dL (ref 0.44–1.00)
GFR, EST NON AFRICAN AMERICAN: 56 mL/min — AB (ref >60)
Glucose, Bld: 114 mg/dL — ABNORMAL HIGH (ref 65–99)
POTASSIUM: 4.1 mmol/L (ref 3.5–5.1)
Sodium: 131 mmol/L — ABNORMAL LOW (ref 135–145)
TOTAL PROTEIN: 8.7 g/dL — AB (ref 6.5–8.1)

## 2015-11-20 LAB — CBC WITH DIFFERENTIAL/PLATELET
BASOS ABS: 0 10*3/uL (ref 0.0–0.1)
Basophils Relative: 0 %
EOS ABS: 0 10*3/uL (ref 0.0–0.7)
Eosinophils Relative: 0 %
HCT: 39 % (ref 36.0–46.0)
HEMOGLOBIN: 12.3 g/dL (ref 12.0–15.0)
LYMPHS ABS: 0.4 10*3/uL — AB (ref 0.7–4.0)
LYMPHS PCT: 2 %
MCH: 24.9 pg — AB (ref 26.0–34.0)
MCHC: 31.5 g/dL (ref 30.0–36.0)
MCV: 79.1 fL (ref 78.0–100.0)
Monocytes Absolute: 1.4 10*3/uL — ABNORMAL HIGH (ref 0.1–1.0)
Monocytes Relative: 9 %
NEUTROS PCT: 89 %
Neutro Abs: 15 10*3/uL — ABNORMAL HIGH (ref 1.7–7.7)
PLATELETS: 444 10*3/uL — AB (ref 150–400)
RBC: 4.93 MIL/uL (ref 3.87–5.11)
RDW: 16.2 % — ABNORMAL HIGH (ref 11.5–15.5)
WBC: 16.8 10*3/uL — AB (ref 4.0–10.5)

## 2015-11-20 LAB — URINALYSIS, ROUTINE W REFLEX MICROSCOPIC
Glucose, UA: NEGATIVE mg/dL
Ketones, ur: 40 mg/dL — AB
LEUKOCYTES UA: NEGATIVE
Nitrite: NEGATIVE
PROTEIN: 100 mg/dL — AB
SPECIFIC GRAVITY, URINE: 1.021 (ref 1.005–1.030)
pH: 6 (ref 5.0–8.0)

## 2015-11-20 LAB — I-STAT TROPONIN, ED
TROPONIN I, POC: 0.01 ng/mL (ref 0.00–0.08)
Troponin i, poc: 0 ng/mL (ref 0.00–0.08)

## 2015-11-20 LAB — URINE MICROSCOPIC-ADD ON

## 2015-11-20 LAB — I-STAT CG4 LACTIC ACID, ED
LACTIC ACID, VENOUS: 2.05 mmol/L — AB (ref 0.5–1.9)
LACTIC ACID, VENOUS: 1.09 mmol/L (ref 0.5–1.9)

## 2015-11-20 LAB — BRAIN NATRIURETIC PEPTIDE: B NATRIURETIC PEPTIDE 5: 272 pg/mL — AB (ref 0.0–100.0)

## 2015-11-20 LAB — D-DIMER, QUANTITATIVE (NOT AT ARMC): D DIMER QUANT: 5.05 ug{FEU}/mL — AB (ref 0.00–0.50)

## 2015-11-20 LAB — CK: CK TOTAL: 683 U/L — AB (ref 38–234)

## 2015-11-20 LAB — TROPONIN I: TROPONIN I: 0.03 ng/mL — AB (ref <0.03)

## 2015-11-20 MED ORDER — SODIUM CHLORIDE 0.9 % IV BOLUS (SEPSIS)
1000.0000 mL | Freq: Once | INTRAVENOUS | Status: AC
  Administered 2015-11-20: 1000 mL via INTRAVENOUS

## 2015-11-20 MED ORDER — DEXTROSE 5 % IV SOLN
500.0000 mg | Freq: Once | INTRAVENOUS | Status: AC
  Administered 2015-11-20: 500 mg via INTRAVENOUS
  Filled 2015-11-20: qty 500

## 2015-11-20 MED ORDER — VANCOMYCIN HCL IN DEXTROSE 1-5 GM/200ML-% IV SOLN: 1000.0000 mg | Freq: Once | INTRAVENOUS | Status: DC

## 2015-11-20 MED ORDER — CARVEDILOL 3.125 MG PO TABS
3.1250 mg | ORAL_TABLET | Freq: Two times a day (BID) | ORAL | Status: DC
Start: 2015-11-21 — End: 2015-11-27
  Administered 2015-11-21 – 2015-11-27 (×13): 3.125 mg via ORAL
  Filled 2015-11-20 (×13): qty 1

## 2015-11-20 MED ORDER — DEXTROSE 5 % IV SOLN
500.0000 mg | INTRAVENOUS | Status: DC
Start: 2015-11-21 — End: 2015-11-24
  Administered 2015-11-21 – 2015-11-23 (×3): 500 mg via INTRAVENOUS
  Filled 2015-11-20 (×3): qty 500

## 2015-11-20 MED ORDER — ENOXAPARIN SODIUM 40 MG/0.4ML ~~LOC~~ SOLN
40.0000 mg | SUBCUTANEOUS | Status: DC
  Administered 2015-11-20 – 2015-11-26 (×7): 40 mg via SUBCUTANEOUS
  Filled 2015-11-20 (×5): qty 0.4

## 2015-11-20 MED ORDER — DEXTROSE 5 % IV SOLN
1.0000 g | INTRAVENOUS | Status: DC
  Administered 2015-11-21 – 2015-11-24 (×4): 1 g via INTRAVENOUS
  Filled 2015-11-20 (×7): qty 10

## 2015-11-20 MED ORDER — ASPIRIN EC 325 MG PO TBEC
325.0000 mg | DELAYED_RELEASE_TABLET | Freq: Every day | ORAL | Status: DC
  Administered 2015-11-20 – 2015-11-27 (×8): 325 mg via ORAL
  Filled 2015-11-20 (×8): qty 1

## 2015-11-20 MED ORDER — ACETAMINOPHEN 325 MG PO TABS: 650.0000 mg | ORAL_TABLET | ORAL | Status: DC | PRN

## 2015-11-20 MED ORDER — SODIUM CHLORIDE 0.9 % IV BOLUS (SEPSIS)
500.0000 mL | Freq: Once | INTRAVENOUS | Status: AC
  Administered 2015-11-20: 500 mL via INTRAVENOUS

## 2015-11-20 MED ORDER — SODIUM CHLORIDE 0.9% FLUSH
3.0000 mL | Freq: Two times a day (BID) | INTRAVENOUS | Status: DC
  Administered 2015-11-21 – 2015-11-25 (×8): 3 mL via INTRAVENOUS

## 2015-11-20 MED ORDER — METOPROLOL TARTRATE 5 MG/5ML IV SOLN
INTRAVENOUS | Status: AC
  Administered 2015-11-20: 2.5 mg
  Filled 2015-11-20: qty 5

## 2015-11-20 MED ORDER — ONDANSETRON HCL 4 MG/2ML IJ SOLN
4.0000 mg | Freq: Four times a day (QID) | INTRAMUSCULAR | Status: DC | PRN
  Filled 2015-11-20: qty 2

## 2015-11-20 MED ORDER — IOPAMIDOL (ISOVUE-370) INJECTION 76%
100.0000 mL | Freq: Once | INTRAVENOUS | Status: AC | PRN
  Administered 2015-11-20: 100 mL via INTRAVENOUS

## 2015-11-20 MED ORDER — SODIUM CHLORIDE 0.9 % IV SOLN
INTRAVENOUS | Status: AC
  Administered 2015-11-20: 22:00:00 via INTRAVENOUS

## 2015-11-20 MED ORDER — DEXTROSE 5 % IV SOLN
1.0000 g | Freq: Once | INTRAVENOUS | Status: AC
  Administered 2015-11-20: 1 g via INTRAVENOUS
  Filled 2015-11-20: qty 10

## 2015-11-20 MED ORDER — SODIUM CHLORIDE 0.9 % IV SOLN: 250.0000 mL | INTRAVENOUS | Status: DC | PRN

## 2015-11-20 MED ORDER — ATORVASTATIN CALCIUM 40 MG PO TABS
80.0000 mg | ORAL_TABLET | Freq: Every day | ORAL | Status: DC
  Administered 2015-11-20 – 2015-11-26 (×7): 80 mg via ORAL
  Filled 2015-11-20 (×8): qty 2

## 2015-11-20 MED ORDER — METOPROLOL TARTRATE 5 MG/5ML IV SOLN: 2.5000 mg | Freq: Once | INTRAVENOUS | Status: AC

## 2015-11-20 MED ORDER — SODIUM CHLORIDE 0.9% FLUSH
3.0000 mL | INTRAVENOUS | Status: DC | PRN
Start: 2015-11-20 — End: 2015-11-27

## 2015-11-20 MED ORDER — RAMIPRIL 1.25 MG PO CAPS
1.2500 mg | ORAL_CAPSULE | Freq: Two times a day (BID) | ORAL | Status: DC
  Administered 2015-11-21 – 2015-11-27 (×13): 1.25 mg via ORAL
  Filled 2015-11-20 (×16): qty 1

## 2015-11-20 NOTE — ED Notes (Signed)
Floor will not take patient until after 7:30 pm.

## 2015-11-20 NOTE — H&P (Signed)
History and Physical  Heather [name] [id number] DOB: 10-14-1929 DOA: 11/20/2015  Referring physician: Billy Fischer, MD PCP: No primary care provider on file.   Chief Complaint: Shortness of breath, productive cough  HPI: Heather [name] is a 82 y.o. independent female on no medication with history of bronchiectasis and remote history of MAC lung infection who apparently has not been followed closely by her primary physician for several years  was brought into the emergency department from home with cough, weakness and shortness of breath. She had been fairly independent prior to arrival and lives alone.  She has no living relatives. The patient has been experiencing generalized weakness with productive cough for the past several days. She was found on the floor but it is not certain how long she had been down.  She says she did not fall down but can't explain how she ended up on the floor.  She says that she noticed progressive weakness over the past 3 days. She denies nausea vomiting and diarrhea. She denies fever and chills. The patient denies loss of consciousness.  The patient was evaluated in the emergency department noted to have pneumonia and sepsis. Code sepsis was instituted. The patient was given antibiotics and fluid hydration, respiratory support and hospitalization was requested.  Also of note, she was noted to have elevated BNP and mild CHF on chest imaging.    Review of Systems: All systems reviewed and apart from history of presenting illness, are negative.  Past Medical History:  Diagnosis Date  . ANA positive 2007   1:640  . Bronchiectasis   . Dry eyes    and photosensitivity  . Fibroids uterine  . Hemoptysis   . Hiatal hernia    presentation as chest pain  . Osteopenia   . 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 Pulmonary infiltrates april 2008   persistent ct infiltrates  . Raynaud's disease   . Spastic dysphonia    Botox therapy, Dr Okey Regal  .  Tremor, essential    Past Surgical History:  Procedure Laterality Date  . cataract surgery     bilaterally  . COLONOSCOPY  2005   negative  . DILATION AND CURETTAGE, DIAGNOSTIC / THERAPEUTIC     Social History:  reports that she quit smoking about 50 years ago. Her smoking use included Cigarettes. She has a 5.40 pack-year smoking history. She has never used smokeless tobacco. She reports that she does not drink alcohol or use drugs.   Allergies  Allergen Reactions  . Azo [Phenazopyridine]     Unknown reaction   . Ciprofloxacin Other (See Comments)    Dizzy     Family History  Problem Relation Age of Onset  . Heart failure Mother   . Osteoporosis Mother   . Stroke Father   . Osteoporosis Father   . Cancer Maternal Aunt     breast   Prior to Admission medications   Not on File   Physical Exam: Vitals:   11/20/15 1625 11/20/15 1630 11/20/15 1745 11/20/15 1826  BP:  130/80 127/63 110/84  Pulse:   97 96  Resp:  (!) 32 (!) 30 (!) 28  Temp:      TempSrc:      SpO2:   96% 93%  Weight: 52.2 kg (115 lb)     Height:         General exam: cachectic elderly patient, appears younger than stated age, lying comfortably supine on the gurney in no  obvious distress.  Head, eyes and ENT: Nontraumatic and normocephalic. Pupils equally reacting to light and accommodation. Oral mucosa very dry.  Neck: Supple. No JVD, carotid bruit or thyromegaly.  Lymphatics: No lymphadenopathy.  Respiratory system: diminished at both bases with rhonchi and scattered crackles.  No increased work of breathing.  Cardiovascular system: S1 and S2 heard, tachycardic. No JVD, murmurs, gallops, clicks or pedal edema.  Gastrointestinal system: Abdomen is nondistended, soft and nontender. Normal bowel sounds heard. No organomegaly or masses appreciated.  Central nervous system: Alert and oriented. No focal neurological deficits.  Extremities: Symmetric 5 x 5 power. Peripheral pulses symmetrically felt.    Skin: No rashes or acute findings.  Musculoskeletal system: Negative exam.  Psychiatry: Pleasant and cooperative.  Labs on Admission:  Basic Metabolic Panel:  Recent Labs Lab 11/20/15 1608  NA 131*  K 4.1  CL 97*  CO2 21*  GLUCOSE 114*  BUN 21*  CREATININE 0.91  CALCIUM 8.9   Liver Function Tests:  Recent Labs Lab 11/20/15 1608  AST 44*  ALT 18  ALKPHOS 96  BILITOT 1.0  PROT 8.7*  ALBUMIN 3.1*   No results for input(s): LIPASE, AMYLASE in the last 168 hours. No results for input(s): AMMONIA in the last 168 hours. CBC:  Recent Labs Lab 11/20/15 1608  WBC 16.8*  NEUTROABS 15.0*  HGB 12.3  HCT 39.0  MCV 79.1  PLT 444*   Cardiac Enzymes: No results for input(s): CKTOTAL, CKMB, CKMBINDEX, TROPONINI in the last 168 hours.  BNP (last 3 results) No results for input(s): PROBNP in the last 8760 hours. CBG: No results for input(s): GLUCAP in the last 168 hours.  Radiological Exams on Admission: Dg Chest 2 View  Result Date: 11/20/2015 CLINICAL DATA:  Cough EXAM: CHEST  2 VIEW COMPARISON:  12/23/2012 FINDINGS: The heart size is normal. Aortic atherosclerosis and tortuosity is identified. There is mild interstitial edema in a small left pleural effusion. Airspace opacities are identified within the right midlung and right upper lobe and left lower lobe. Chronic interstitial changes of COPD/emphysema noted. IMPRESSION: 1. Suspect mild CHF. 2. Bilateral multifocal airspace opacities are identified compatible with pneumonia. Followup PA and lateral chest X-ray is recommended in 3-4 weeks following trial of antibiotic therapy to ensure resolution and exclude underlying malignancy. Electronically Signed   By: Kerby Moors M.D.   On: 11/20/2015 16:58    EKG: Independently reviewed. Probable sinus tachycardia ST depression, probably rate related No previous ECGs available  Assessment/Plan Principal Problem:   Sepsis ([place]) Active Problems:   CAP (community  acquired pneumonia)   Mild CHF exacerbation   Elevated lactic acid level   MAC (mycobacterium avium-intracellulare complex)   RAYNAUD'S DISEASE   VOCAL CORD DISORDER   FTT (failure to thrive) in adult   Protein-calorie malnutrition, severe (HCC)   Recurrent falls   Non-compliant behavior   Leukocytosis   Generalized weakness   Productive cough   Bronchiectasis (HCC)   Hyponatremia  1. Severe sepsis secondary to immunity acquired pneumonia-continue sepsis protocol with IV hydration and supportive therapy. Follow blood cultures. Follow sputum cultures. Recheck lactic acid within 6 hours to be certain it is trending down. 2. Community acquired pneumonia-patient empirically started on Rocephin and Zithromax IV. Sputum cultures ordered. Follow blood cultures. Continue respiratory support. 3. Hyponatremia-likely secondary to dehydration-suspect this will improve with IV fluid hydration. Recheck BMP in the morning. 4. Leukocytosis-secondary to sepsis and pneumonia-repeat CBC in the morning. 5. Sepsis associated Elevated lactic acid-repeat within 6 hours  after fluid hydration. 6. Generalized weakness-PTOT evaluation warranted when medically stabilized.  Pt adamant that she wants to return home to live independently. 7. Bronchiectasis with history of MAC infection-patient has not followed up recently with pulmonology. Consider getting them involved if she is not rapidly improving with current treatment. 8. Severe protein calorie malnutrition-consult dietitian for recommendations. 9. History of recurrent falls-as above will consult PT when stabilized. 10. Mild congestive heart failure with acute exacerbation-elevated BNP noted, clinically will need to fluid resuscitate given severe sepsis and elevated lactic acid levels, as lactic acid levels improve will rapidly cut back on fluid resuscitation, monitor fluid intake and output closely, monitor weights, diuresis as needed. Also trending cardiac  troponin.  Check 2-D echocardiogram. 11. Found on floor-check CPK level and repeat in the morning.    DVT Prophylaxis: enoxaparin Code Status: full  Disposition Plan: TBD   Time spent: 95 mins  Irwin Brakeman, MD [facility] Pager [phone]  If 7PM-7AM, please contact night-coverage [email or web address] Password [facility] 11/20/2015, 6:42 PM

## 2015-11-20 NOTE — Significant Event (Signed)
Rapid Response Event Note  Overview: Time Called: 2045 Arrival Time: 2045 Event Type: Respiratory, Cardiac  Initial Focused Assessment:  Pt lying in bed, c/o chest pain, pt poor historian, but advised pain has been goin gon for 2 to 3 days, currently describes her pain as being under L breast, along bottom of rib cage, unable to rate pain, but states its the same pain as she has had.  Pain actually decreases with insipration.  Has a intermittent cough and pain increases with cough.  12-lead EKG done prior to my arrival, shows SVT rate 150s.  On RRT monitor pts HR 158, other VS stable.  Dr. Maudie Mercury at bedside to evaluate.  Lopressor 2.5mg  IV given, within 3-5 min, HR down to 80s, with BP 112/60.  Pt states the chest pain is gone unless coughing.  Dr. Maudie Mercury present and made aware.  Another 12-lead EKG done to confirm NSR.  Pt required no further treatment.  Interventions:  12-Lead EKG, Lopressor 2.5 mg IV, other orders entered by Dr. Maudie Mercury  Plan of Care (if not transferred): Notify RRT at ext. [phone], if any episodes of SVT or any other concerns.  Event Summary: Name of Physician Notified: Dr. Maudie Mercury at 2050    at    Outcome: Stayed in room and stabalized  Event End Time: 2115  Elio Forget

## 2015-11-20 NOTE — ED Notes (Signed)
RN made aware of critical lactic acid value 

## 2015-11-20 NOTE — Progress Notes (Signed)
CRITICAL VALUE ALERT  Critical value received:  Troponin 0.03  Date of notification: 11/20/15  Time of notification:  2151  Critical value read back:Yes.    Nurse who received alert:  Star Age  MD notified (1st page):  Dr. Maudie Mercury  Time of first page:  2151  MD notified (2nd page):  Time of second page:  Responding MD: Jonette Eva  Time MD responded:  1026

## 2015-11-20 NOTE — Progress Notes (Signed)
Report given by ongoing nurse. Agreed with nurse assessment of patient and will cont to monitor.  

## 2015-11-20 NOTE — ED Notes (Signed)
Report given to [place] on  4E

## 2015-11-20 NOTE — Progress Notes (Signed)
EDCM spoke to patient at bedside.  Patient confrims she lives alone.  She reports she has friends who check in on her almost every day.  shee reports she has an aide who comes to her house M-F from 11am to 3pm from [place] who assists her with ADL's, cleaning, food shopping and appointments.  She reports her pcp is Dr. Linna Darner, but reports she hasn't seen him since he has gone to "special care."  She reports she has a wheelchair and a walker at home.  Patient reports she did not fall at home, but could not remember how she got to the floor.  Patient reports she ambulates without the use of dme.  EDCM provided patient with list of home health agencies in [facility], explained services.  EDCM explained home health agency of her choice has 24-48 hours to contact her.  Patient has chosen [facility] for services.  Patient thankful for assistance.  No further EDCM needs at this time.

## 2015-11-20 NOTE — Progress Notes (Signed)
HR 145-155. Pt states " my chest hurts when I breath and I've been SOB for a few days". NP on call notified and request I call Rapid response RN, rapid response called and on the  way to pt room. Will continue to monitor closely.

## 2015-11-20 NOTE — ED Notes (Signed)
MD at bedside. 

## 2015-11-20 NOTE — ED Provider Notes (Signed)
[facility] DEPT Provider Note   CSN: [id number] Arrival date & time: 11/20/15  1507     History   Chief Complaint Chief Complaint  Patient presents with  . Weakness    HPI Heather [name] is a 82 y.o. female.  HPI   Presents with cough productive of yellow phlegm for 3 days. Severe cough. No fevers.  Not quite feeling self and reports she was got on the floor and crawled to the bathroom. Not feeling short of breath. No chest pain.    Past Medical History:  Diagnosis Date  . ANA positive 2007   1:640  . Bronchiectasis   . Dry eyes    and photosensitivity  . Fibroids uterine  . Hemoptysis   . Hiatal hernia    presentation as chest pain  . Osteopenia   . 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 Pulmonary infiltrates april 2008   persistent ct infiltrates  . Raynaud's disease   . Spastic dysphonia    Botox therapy, Dr Okey Regal  . Tremor, essential     Patient Active Problem List   Diagnosis Date Noted  . Paroxysmal SVT (supraventricular tachycardia) ([place]) 11/21/2015  . Sepsis ([place]) 11/20/2015  . CAP (community acquired pneumonia) 11/20/2015  . Leukocytosis 11/20/2015  . Generalized weakness 11/20/2015  . Productive cough 11/20/2015  . Bronchiectasis ([place]) 11/20/2015  . Mild CHF exacerbation 11/20/2015  . Hyponatremia 11/20/2015  . Elevated lactic acid level 11/20/2015  . Severe dehydration 11/20/2015  . Chest pain 11/20/2015  . Non-compliant behavior 10/29/2013  . DVT (deep venous thrombosis) ([place]) 10/26/2013  . Recurrent falls 09/18/2013  . Protein-calorie malnutrition, severe ([place]) 09/12/2013  . Sciatica 09/10/2013  . FTT (failure to thrive) in adult 09/10/2013  . Unspecified adverse effect of unspecified drug, medicinal and biological substance 08/19/2011  . Hyperlipidemia 12/01/2010  . VOCAL CORD DISORDER 04/27/2007  . HEMOPTYSIS 02/26/2007  . MAC (mycobacterium avium-intracellulare complex) 12/22/2006  . FIBROIDS, UTERUS  06/12/2006  . TREMOR, ESSENTIAL 06/12/2006  . RAYNAUD'S DISEASE 06/12/2006  . OSTEOPENIA 06/12/2006    Past Surgical History:  Procedure Laterality Date  . cataract surgery     bilaterally  . COLONOSCOPY  2005   negative  . DILATION AND CURETTAGE, DIAGNOSTIC / THERAPEUTIC      OB History    No data available       Home Medications    Prior to Admission medications   Not on File    Family History Family History  Problem Relation Age of Onset  . Heart failure Mother   . Osteoporosis Mother   . Stroke Father   . Osteoporosis Father   . Cancer Maternal Aunt     breast    Social History Social History  Substance Use Topics  . Smoking status: Former Smoker    Packs/day: 0.30    Years: 18.00    Types: Cigarettes    Quit date: 03/07/1965  . Smokeless tobacco: Never Used     Comment: started smoking at age 76  . Alcohol use No     Allergies   Azo [phenazopyridine] and Ciprofloxacin   Review of Systems Review of Systems  Constitutional: Positive for fatigue. Negative for fever.  HENT: Negative for sore throat.   Eyes: Negative for visual disturbance.  Respiratory: Positive for cough. Negative for shortness of breath.   Cardiovascular: Negative for chest pain.  Gastrointestinal: Negative for abdominal pain, nausea and vomiting.  Genitourinary: Negative for difficulty urinating.  Musculoskeletal: Negative  for back pain and neck pain.  Skin: Negative for rash.  Neurological: Negative for syncope and headaches.     Physical Exam Updated Vital Signs BP (!) 89/46 (BP Location: Right Arm) Comment: RN notified   Pulse 87   Temp 98.2 F (36.8 C) (Oral)   Resp 20   Ht 5\' 5"  (1.651 m)   Wt 108 lb 0.4 oz (49 kg)   SpO2 93%   BMI 17.98 kg/m   Physical Exam  Constitutional: She is oriented to person, place, and time. She appears well-developed and well-nourished. No distress.  HENT:  Head: Normocephalic and atraumatic.  Eyes: Conjunctivae and EOM are  normal.  Neck: Normal range of motion.  Cardiovascular: Regular rhythm, normal heart sounds and intact distal pulses.  Tachycardia present.  Exam reveals no gallop and no friction rub.   No murmur heard. Pulmonary/Chest: Effort normal. No respiratory distress. She has no wheezes. She has rhonchi (left). She has no rales.  Abdominal: Soft. She exhibits no distension. There is no tenderness. There is no guarding.  Musculoskeletal: She exhibits no edema or tenderness.  Neurological: She is alert and oriented to person, place, and time.  Skin: Skin is warm and dry. No rash noted. She is not diaphoretic. No erythema.  Nursing note and vitals reviewed.    ED Treatments / Results  Labs (all labs ordered are listed, but only abnormal results are displayed) Labs Reviewed  COMPREHENSIVE METABOLIC PANEL - Abnormal; Notable for the following:       Result Value   Sodium 131 (*)    Chloride 97 (*)    CO2 21 (*)    Glucose, Bld 114 (*)    BUN 21 (*)    Total Protein 8.7 (*)    Albumin 3.1 (*)    AST 44 (*)    GFR calc non Af Amer 56 (*)    All other components within normal limits  CBC WITH DIFFERENTIAL/PLATELET - Abnormal; Notable for the following:    WBC 16.8 (*)    MCH 24.9 (*)    RDW 16.2 (*)    Platelets 444 (*)    Neutro Abs 15.0 (*)    Lymphs Abs 0.4 (*)    Monocytes Absolute 1.4 (*)    All other components within normal limits  URINALYSIS, ROUTINE W REFLEX MICROSCOPIC (NOT AT [facility]) - Abnormal; Notable for the following:    Color, Urine AMBER (*)    APPearance CLOUDY (*)    Hgb urine dipstick SMALL (*)    Bilirubin Urine MODERATE (*)    Ketones, ur 40 (*)    Protein, ur 100 (*)    All other components within normal limits  BRAIN NATRIURETIC PEPTIDE - Abnormal; Notable for the following:    B Natriuretic Peptide 272.0 (*)    All other components within normal limits  URINE MICROSCOPIC-ADD ON - Abnormal; Notable for the following:    Squamous Epithelial / LPF 0-5 (*)     Bacteria, UA FEW (*)    Casts GRANULAR CAST (*)    All other components within normal limits  BASIC METABOLIC PANEL - Abnormal; Notable for the following:    Sodium 130 (*)    Chloride 100 (*)    Calcium 8.1 (*)    GFR calc non Af Amer 58 (*)    All other components within normal limits  CBC WITH DIFFERENTIAL/PLATELET - Abnormal; Notable for the following:    WBC 16.4 (*)    Hemoglobin 10.5 (*)  HCT 32.6 (*)    MCV 76.7 (*)    MCH 24.7 (*)    RDW 16.1 (*)    Neutro Abs 13.9 (*)    Monocytes Absolute 1.5 (*)    All other components within normal limits  CK - Abnormal; Notable for the following:    Total CK 683 (*)    All other components within normal limits  CK - Abnormal; Notable for the following:    Total CK 511 (*)    All other components within normal limits  TROPONIN I - Abnormal; Notable for the following:    Troponin I 0.03 (*)    All other components within normal limits  TROPONIN I - Abnormal; Notable for the following:    Troponin I 0.03 (*)    All other components within normal limits  TROPONIN I - Abnormal; Notable for the following:    Troponin I 0.03 (*)    All other components within normal limits  D-DIMER, QUANTITATIVE (NOT AT [facility]) - Abnormal; Notable for the following:    D-Dimer, Quant 5.05 (*)    All other components within normal limits  CK TOTAL AND CKMB (NOT AT [facility]) - Abnormal; Notable for the following:    Total CK 667 (*)    CK, MB 24.8 (*)    Relative Index 3.7 (*)    All other components within normal limits  LIPID PANEL - Abnormal; Notable for the following:    HDL 24 (*)    All other components within normal limits  I-STAT CG4 LACTIC ACID, ED - Abnormal; Notable for the following:    Lactic Acid, Venous 2.05 (*)    All other components within normal limits  CULTURE, BLOOD (ROUTINE X 2)  CULTURE, BLOOD (ROUTINE X 2)  URINE CULTURE  CULTURE, EXPECTORATED SPUTUM-ASSESSMENT  ACID FAST SMEAR (AFB)  ACID FAST CULTURE WITH REFLEXED  SENSITIVITIES  CULTURE, RESPIRATORY (NON-EXPECTORATED)  BLOOD CULTURE ID PANEL (REFLEXED)  STREP PNEUMONIAE URINARY ANTIGEN  TSH  HIV ANTIBODY (ROUTINE TESTING)  LEGIONELLA PNEUMOPHILA SEROGP 1 UR AG  HEMOGLOBIN A1C  I-STAT TROPOININ, ED  I-STAT CG4 LACTIC ACID, ED  I-STAT TROPOININ, ED  I-STAT TROPOININ, ED    EKG  EKG Interpretation  Date/Time:  Friday November 20 2015 17:03:02 EDT Ventricular Rate:  98 PR Interval:    QRS Duration: 117 QT Interval:  375 QTC Calculation: 479 R Axis:   44 Text Interpretation:  Normal sinus rhythm Incomplete left bundle branch block Low voltage, extremity and precordial leads Since prior ECG, rate has slowed Confirmed by [facility] MD, [place] ([id number]) on 11/21/2015 12:42:03 PM       Radiology Dg Chest 2 View  Result Date: 11/20/2015 CLINICAL DATA:  Cough EXAM: CHEST  2 VIEW COMPARISON:  12/23/2012 FINDINGS: The heart size is normal. Aortic atherosclerosis and tortuosity is identified. There is mild interstitial edema in a small left pleural effusion. Airspace opacities are identified within the right midlung and right upper lobe and left lower lobe. Chronic interstitial changes of COPD/emphysema noted. IMPRESSION: 1. Suspect mild CHF. 2. Bilateral multifocal airspace opacities are identified compatible with pneumonia. Followup PA and lateral chest X-ray is recommended in 3-4 weeks following trial of antibiotic therapy to ensure resolution and exclude underlying malignancy. Electronically Signed   By: Kerby Moors M.D.   On: 11/20/2015 16:58   Ct Angio Chest Pe W Or Wo Contrast  Result Date: 11/20/2015 CLINICAL DATA:  Elevated D-dimer. Tachycardia. Chest pain. Shortness of breath. EXAM: CT ANGIOGRAPHY CHEST  WITH CONTRAST TECHNIQUE: Multidetector CT imaging of the chest was performed using the standard protocol during bolus administration of intravenous contrast. Multiplanar CT image reconstructions and MIPs were obtained to evaluate the vascular  anatomy. CONTRAST:  100 cc Isovue 370 IV COMPARISON:  Chest radiograph earlier this day. High-resolution chest CT 06/02/2010, no interval imaging FINDINGS: Cardiovascular: There are no filling defects within the pulmonary arteries to suggest pulmonary embolus. The thoracic aorta is normal in caliber with mild atherosclerosis. A few coronary artery calcifications are seen. Mitral valvular calcifications are seen. Mediastinum/Nodes: Enlarged subcarinal lymph node measures 1.7 cm. Soft tissue density at the right hila without well-defined noted. An upper paratracheal node measures 10 mm. There is scattered esophageal wall thickening. No pericardial effusion. Lungs/Pleura: Cylindrical bronchiectasis within all lobes of both lungs has progressed from the prior exam. There is complete volume loss in the right middle lobe with whole lobe atelectasis, minimal aerated lung paramediastinal. Development of peripheral cavitary lesions in the right upper and lower lobes. In the right upper lobe cavitary process is complex, measuring at least 6.9 x 2.8 cm to encompass multiple adjacent cavitary densities. These lesions have a thick wall. More superiorly is subpleural cavitary formation with thick wall in the medial apex. In the lower lobe, cavitary process measures at least 3.3 x 2.5 cm with probable small fluid level. There fluid soft tissue density within the right lower lobe cavitary lesions. Bronchiectasis in the left lung is less severe, most prominent in the lingula and central lower lobe. There are peripheral consolidations in the left lower lobe and lingula appear confluent. Ground-glass and nodular opacities with tree in bud opacities are seen throughout the lungs. There is a small left pleural effusion. Multifocal right pleural thickening without discrete effusion. No definite septal thickening suggest pulmonary edema. Upper Abdomen: No acute abnormality. Musculoskeletal: Exaggerated thoracic kyphosis. There are no  acute or suspicious osseous abnormalities. Review of the MIP images confirms the above findings. IMPRESSION: 1. No pulmonary embolus. 2. Marked progression since 2012 of bronchiectasis primarily in the right lung, with near complete atelectasis of the right middle lobe. Development of thick-walled cavitary lesions in the right upper and lower lobe which are contiguous with bronchiectasis. There is fluid and soft tissue density within the lower lobe cavitary lesions. Findings may be due to progressive infection versus malignancy. 3. Peripheral consolidations in the lingula and left lower lobe may be part of the background process versus acute pneumonia. These results will be called to the ordering clinician or representative by the Radiologist Assistant, and communication documented in the PACS or zVision Dashboard. Electronically Signed   By: Jeb Levering M.D.   On: 11/20/2015 23:48    Procedures Procedures (including critical care time)  Medications Ordered in ED Medications  sodium chloride flush (NS) 0.9 % injection 3 mL (3 mLs Intravenous Given 11/21/15 1000)  sodium chloride flush (NS) 0.9 % injection 3 mL (not administered)  0.9 %  sodium chloride infusion (not administered)  acetaminophen (TYLENOL) tablet 650 mg (not administered)  ondansetron (ZOFRAN) injection 4 mg (not administered)  ramipril (ALTACE) capsule 1.25 mg (1.25 mg Oral Given 11/21/15 1019)  enoxaparin (LOVENOX) injection 40 mg (40 mg Subcutaneous Given 11/20/15 2139)  cefTRIAXone (ROCEPHIN) 1 g in dextrose 5 % 50 mL IVPB (not administered)  azithromycin (ZITHROMAX) 500 mg in dextrose 5 % 250 mL IVPB (not administered)  0.9 %  sodium chloride infusion ( Intravenous Stopped 11/21/15 1000)  aspirin EC tablet 325 mg (325 mg Oral Given  11/21/15 1017)  atorvastatin (LIPITOR) tablet 80 mg (80 mg Oral Given 11/20/15 2144)  carvedilol (COREG) tablet 3.125 mg (3.125 mg Oral Given 11/21/15 1019)  guaiFENesin (ROBITUSSIN) 100 MG/5ML  solution 100 mg (100 mg Oral Given 11/21/15 0509)  guaiFENesin (MUCINEX) 12 hr tablet 600 mg (600 mg Oral Given 11/21/15 1017)  metoprolol (LOPRESSOR) injection 2.5-5 mg (5 mg Intravenous Given 11/21/15 1121)  sodium chloride 0.9 % bolus 1,000 mL (0 mLs Intravenous Stopped 11/20/15 1747)    And  sodium chloride 0.9 % bolus 500 mL (0 mLs Intravenous Stopped 11/20/15 1739)  cefTRIAXone (ROCEPHIN) 1 g in dextrose 5 % 50 mL IVPB (0 g Intravenous Stopped 11/20/15 1747)  azithromycin (ZITHROMAX) 500 mg in dextrose 5 % 250 mL IVPB (0 mg Intravenous Stopped 11/20/15 1854)  metoprolol (LOPRESSOR) 5 MG/5ML injection (2.5 mg  Given 11/20/15 2100)  metoprolol (LOPRESSOR) injection 2.5 mg (0 mg Intravenous Duplicate [id number])  iopamidol (ISOVUE-370) 76 % injection 100 mL (100 mLs Intravenous Contrast Given 11/20/15 2250)  metoprolol (LOPRESSOR) injection 2.5 mg (2.5 mg Intravenous Given 11/21/15 1046)  metoprolol (LOPRESSOR) 5 MG/5ML injection (  Duplicate [id number])     Initial Impression / Assessment and Plan / ED Course  I have reviewed the triage vital signs and the nursing notes.  Pertinent labs & imaging results that were available during my care of the patient were reviewed by me and considered in my medical decision making (see chart for details).  Clinical Course   82yo female with history of bronchiectasis, past DVT, presents with concern for cough. Patient tachycardic to 160, and although p waves difficult to see in some locations feel it is likely secondary to rate and see some pwaves and feel it is likely sinus tachycardia. Given fluids with resolution.  Patient with signs of bilateral pneumonia, lactic acid 2, given 30cc/kg and code sepsis initiated with pt receiving rocephin and azithromycin for CAP.  No dyspnea, no leg pain or swelling, doubt PE. Pt admitted for further care.   Final Clinical Impressions(s) / ED Diagnoses   Final diagnoses:  CAP (community acquired pneumonia)  Sepsis,  due to unspecified organism [id number])    New Prescriptions There are no discharge medications for this patient.    Gareth Morgan, MD 11/21/15 [phone]

## 2015-11-20 NOTE — ED Notes (Signed)
Contact information:  [facility] w/ Jane[facility]  Room 4 [phone]

## 2015-11-20 NOTE — Progress Notes (Addendum)
Patient ID: Heather [name], female   DOB: 26-Sep-1929, 82 y.o.   MRN: [id number]                                                                PROGRESS NOTE                                                                                                                                                                                                             Patient Demographics:    Heather [name], is a 82 y.o. female, DOB - Jul 06, 1929, [id number]  Admit date - 11/20/2015   Admitting Physician Murlean Iba, MD  Outpatient Primary MD for the patient is Unice Cobble, MD  LOS - 0  Outpatient Specialists:  Chief Complaint  Patient presents with  . Weakness       Brief Narrative  82 y.o. independent female on no medication with history of bronchiectasis and remote history of MAC lung infection who apparently has not been followed closely by her primary physician for several years  was brought into the emergency department from home with cough, weakness and shortness of breath. She had been fairly independent prior to arrival and lives alone.  She has no living relatives. The patient has been experiencing generalized weakness with productive cough for the past several days. She was found on the floor but it is not certain how long she had been down.  She says she did not fall down but can't explain how she ended up on the floor.  She says that she noticed progressive weakness over the past 3 days. She denies nausea vomiting and diarrhea. She denies fever and chills. The patient denies loss of consciousness.  The patient was evaluated in the emergency department noted to have pneumonia and sepsis. Code sepsis was instituted. The patient was given antibiotics and fluid hydration, respiratory support and hospitalization was requested.  Also of note, she was noted to have elevated BNP and mild CHF on chest imaging.     Subjective:    Heather [name] today has c/o CP. Dull achy,  Left and right sided,  worse with lying down. + slight cough, + dyspnea.  Denies fever, chills. Palp n/v, diarrhea, brbpr, black stool.  Pt has had cp for the past 3 days.  EKG: ST at 150-160.  12 lead pending.  Valsalva and carotid massage attempted without success. After metoprolol 2.5mg  iv  EKG nsr at 78, nl axis, no st-t changes c/w ischemia   Assessment  & Plan :    Principal Problem:   Sepsis ([place]) Active Problems:   MAC (mycobacterium avium-intracellulare complex)   RAYNAUD'S DISEASE   VOCAL CORD DISORDER   FTT (failure to thrive) in adult   Protein-calorie malnutrition, severe (HCC)   Recurrent falls   Non-compliant behavior   CAP (community acquired pneumonia)   Leukocytosis   Generalized weakness   Productive cough   Bronchiectasis (HCC)   Mild CHF exacerbation   Hyponatremia   Elevated lactic acid level   Severe dehydration   1. Cp Tele Trop I q6hx 3 Check D dimer Start aspirin 325mg  po qday Start Lipitor 80mg  po qday Metoprolol 2.5mg  iv x1  Start carvedilol 3.125mg  po bid If D dimer is positive then please check CTA chest R/o PE  2. Tachycardia Check tsh Metoprolol 2.5mg  iv x1  Cardiac echo pending  3.  CAP, hx of MAC Check AFB sputum x3 Since pt states having cough x 52months.  Cont rocephin and zithromax for now If + MAC please consult pulmonary / ID  Historically tx with  Zithromax, Ethambutol, Rifampin    Code Status :  FULL CODE  Family Communication  : w patient  Disposition Plan  : uncertain  Barriers For Discharge :   Consults  :    Procedures  :   DVT Prophylaxis  :  Lovenox -  SCDs   Lab Results  Component Value Date   PLT 444 (H) 11/20/2015    Antibiotics  :    Anti-infectives    Start     Dose/Rate Route Frequency Ordered Stop   11/21/15 1700  azithromycin (ZITHROMAX) 500 mg in dextrose 5 % 250 mL IVPB     500 mg 250 mL/hr over 60 Minutes Intravenous Every 24 hours 11/20/15 2004 11/28/15 1659   11/21/15 1600  cefTRIAXone (ROCEPHIN) 1  g in dextrose 5 % 50 mL IVPB     1 g 100 mL/hr over 30 Minutes Intravenous Every 24 hours 11/20/15 2004 11/28/15 1559   11/20/15 1715  cefTRIAXone (ROCEPHIN) 1 g in dextrose 5 % 50 mL IVPB     1 g 100 mL/hr over 30 Minutes Intravenous  Once 11/20/15 1711 11/20/15 1747   11/20/15 1715  azithromycin (ZITHROMAX) 500 mg in dextrose 5 % 250 mL IVPB     500 mg 250 mL/hr over 60 Minutes Intravenous  Once 11/20/15 1711 11/20/15 1854   11/20/15 1645  vancomycin (VANCOCIN) IVPB 1000 mg/200 mL premix  Status:  Discontinued     1,000 mg 200 mL/hr over 60 Minutes Intravenous  Once 11/20/15 1633 11/20/15 1633        Objective:   Vitals:   11/20/15 1826 11/20/15 1900 11/20/15 1908 11/20/15 1958  BP: 110/84 116/73 116/73 136/63  Pulse: 96 106 102 83  Resp: (!) 28 (!) 27 (!) 30 (!) 26  Temp:    97.9 F (36.6 C)  TempSrc:    Oral  SpO2: 93% 95% 96%   Weight:    48.7 kg (107 lb 5.8 oz)  Height:    5\' 5"  (1.651 m)    Wt Readings from Last 3 Encounters:  11/20/15 48.7 kg (107 lb 5.8 oz)  11/08/13 54.7 kg (120 lb 9.6 oz)  09/10/13 46.9 kg (103 lb 6.3 oz)  No intake or output data in the 24 hours ending 11/20/15 2101   Physical Exam  Awake Alert, Oriented X 3, No new F.N deficits, Normal affect [place].AT,PERRAL Supple Neck,No JVD, No cervical lymphadenopathy appriciated.  Symmetrical Chest wall movement, Good air movement bilaterally, slight bibasilar crackles RRR,No Gallops,Rubs or new Murmurs, No Parasternal Heave +ve B.Sounds, Abd Soft, No tenderness, No organomegaly appriciated, No rebound - guarding or rigidity. No Cyanosis, Clubbing or edema, No new Rash or bruise      Data Review:    CBC  Recent Labs Lab 11/20/15 1608  WBC 16.8*  HGB 12.3  HCT 39.0  PLT 444*  MCV 79.1  MCH 24.9*  MCHC 31.5  RDW 16.2*  LYMPHSABS 0.4*  MONOABS 1.4*  EOSABS 0.0  BASOSABS 0.0    Chemistries   Recent Labs Lab 11/20/15 1608  NA 131*  K 4.1  CL 97*  CO2 21*  GLUCOSE 114*  BUN  21*  CREATININE 0.91  CALCIUM 8.9  AST 44*  ALT 18  ALKPHOS 96  BILITOT 1.0   ------------------------------------------------------------------------------------------------------------------ No results for input(s): CHOL, HDL, LDLCALC, TRIG, CHOLHDL, LDLDIRECT in the last 72 hours.  No results found for: HGBA1C ------------------------------------------------------------------------------------------------------------------ No results for input(s): TSH, T4TOTAL, T3FREE, THYROIDAB in the last 72 hours.  Invalid input(s): FREET3 ------------------------------------------------------------------------------------------------------------------ No results for input(s): VITAMINB12, FOLATE, FERRITIN, TIBC, IRON, RETICCTPCT in the last 72 hours.  Coagulation profile No results for input(s): INR, PROTIME in the last 168 hours.  No results for input(s): DDIMER in the last 72 hours.  Cardiac Enzymes No results for input(s): CKMB, TROPONINI, MYOGLOBIN in the last 168 hours.  Invalid input(s): CK ------------------------------------------------------------------------------------------------------------------    Component Value Date/Time   BNP 272.0 (H) 11/20/2015 1608    Inpatient Medications  Scheduled Meds: . metoprolol      . aspirin EC  325 mg Oral Daily  . atorvastatin  80 mg Oral q1800  . [START ON 11/21/2015] azithromycin  500 mg Intravenous Q24H  . [START ON 11/21/2015] carvedilol  3.125 mg Oral BID WC  . [START ON 11/21/2015] cefTRIAXone (ROCEPHIN)  IV  1 g Intravenous Q24H  . enoxaparin (LOVENOX) injection  40 mg Subcutaneous Q24H  . [START ON 11/21/2015] ramipril  1.25 mg Oral BID  . sodium chloride flush  3 mL Intravenous Q12H   Continuous Infusions: . sodium chloride     PRN Meds:.sodium chloride, acetaminophen, ondansetron (ZOFRAN) IV, sodium chloride flush  Micro Results No results found for this or any previous visit (from the past 240 hour(s)).  Radiology  Reports Dg Chest 2 View  Result Date: 11/20/2015 CLINICAL DATA:  Cough EXAM: CHEST  2 VIEW COMPARISON:  12/23/2012 FINDINGS: The heart size is normal. Aortic atherosclerosis and tortuosity is identified. There is mild interstitial edema in a small left pleural effusion. Airspace opacities are identified within the right midlung and right upper lobe and left lower lobe. Chronic interstitial changes of COPD/emphysema noted. IMPRESSION: 1. Suspect mild CHF. 2. Bilateral multifocal airspace opacities are identified compatible with pneumonia. Followup PA and lateral chest X-ray is recommended in 3-4 weeks following trial of antibiotic therapy to ensure resolution and exclude underlying malignancy. Electronically Signed   By: Kerby Moors M.D.   On: 11/20/2015 16:58    Time Spent in minutes  30 critical care time   Jani Gravel M.D on 11/20/2015 at 9:01 PM  Between 7am to 7pm - Pager - [phone]  After 7pm go to [email or web address] - password [email or web address]  [facility] -  Office  [phone]

## 2015-11-20 NOTE — ED Notes (Signed)
Patient transported to X-ray 

## 2015-11-20 NOTE — Progress Notes (Signed)
[place] asked to assist with dosing of Ceftriaxone and Azithromycin for sepsis secondary to CAP. Dosage remains stable at Ceftriaxone 1g IV q24h and Azithromycin 500mg  IV q24h and need for further dosage adjustment appears unlikely at present.    Will sign off at this time.  Please reconsult if a change in clinical status warrants re-evaluation.   Lindell Spar, PharmD, BCPS Pager: [phone] 11/20/2015 8:08 PM

## 2015-11-20 NOTE — ED Triage Notes (Signed)
Patient is from home.  Patient has generalized weakness with productive cough. Patient was on floor but not sure how long.  Patient has been getting weaker over the past three days.    Denies n/v/d.  Denies LOC.  BP:143/76 P:100 98% on room air  CBG:115

## 2015-11-20 NOTE — ED Notes (Signed)
Admitting MD at bedside.

## 2015-11-21 ENCOUNTER — Inpatient Hospital Stay (HOSPITAL_COMMUNITY): Payer: Medicare (Managed Care)

## 2015-11-21 DIAGNOSIS — J189 Pneumonia, unspecified organism: Secondary | ICD-10-CM

## 2015-11-21 DIAGNOSIS — R079 Chest pain, unspecified: Secondary | ICD-10-CM

## 2015-11-21 DIAGNOSIS — I471 Supraventricular tachycardia, unspecified: Secondary | ICD-10-CM | POA: Diagnosis not present

## 2015-11-21 DIAGNOSIS — A310 Pulmonary mycobacterial infection: Secondary | ICD-10-CM

## 2015-11-21 DIAGNOSIS — R4689 Other symptoms and signs involving appearance and behavior: Secondary | ICD-10-CM

## 2015-11-21 DIAGNOSIS — J471 Bronchiectasis with (acute) exacerbation: Secondary | ICD-10-CM

## 2015-11-21 DIAGNOSIS — I509 Heart failure, unspecified: Secondary | ICD-10-CM

## 2015-11-21 DIAGNOSIS — D72829 Elevated white blood cell count, unspecified: Secondary | ICD-10-CM

## 2015-11-21 DIAGNOSIS — J479 Bronchiectasis, uncomplicated: Secondary | ICD-10-CM

## 2015-11-21 DIAGNOSIS — A419 Sepsis, unspecified organism: Principal | ICD-10-CM

## 2015-11-21 DIAGNOSIS — R627 Adult failure to thrive: Secondary | ICD-10-CM

## 2015-11-21 DIAGNOSIS — E871 Hypo-osmolality and hyponatremia: Secondary | ICD-10-CM

## 2015-11-21 DIAGNOSIS — E872 Acidosis: Secondary | ICD-10-CM

## 2015-11-21 DIAGNOSIS — R531 Weakness: Secondary | ICD-10-CM

## 2015-11-21 LAB — CBC WITH DIFFERENTIAL/PLATELET
BASOS ABS: 0 10*3/uL (ref 0.0–0.1)
Basophils Relative: 0 %
Eosinophils Absolute: 0 10*3/uL (ref 0.0–0.7)
Eosinophils Relative: 0 %
HEMATOCRIT: 32.6 % — AB (ref 36.0–46.0)
Hemoglobin: 10.5 g/dL — ABNORMAL LOW (ref 12.0–15.0)
LYMPHS PCT: 5 %
Lymphs Abs: 0.9 10*3/uL (ref 0.7–4.0)
MCH: 24.7 pg — ABNORMAL LOW (ref 26.0–34.0)
MCHC: 32.2 g/dL (ref 30.0–36.0)
MCV: 76.7 fL — AB (ref 78.0–100.0)
Monocytes Absolute: 1.5 10*3/uL — ABNORMAL HIGH (ref 0.1–1.0)
Monocytes Relative: 9 %
NEUTROS ABS: 13.9 10*3/uL — AB (ref 1.7–7.7)
Neutrophils Relative %: 86 %
Platelets: 400 10*3/uL (ref 150–400)
RBC: 4.25 MIL/uL (ref 3.87–5.11)
RDW: 16.1 % — ABNORMAL HIGH (ref 11.5–15.5)
WBC: 16.4 10*3/uL — AB (ref 4.0–10.5)

## 2015-11-21 LAB — BLOOD CULTURE ID PANEL (REFLEXED)
Acinetobacter baumannii: NOT DETECTED
CANDIDA ALBICANS: NOT DETECTED
CANDIDA GLABRATA: NOT DETECTED
CANDIDA KRUSEI: NOT DETECTED
CANDIDA TROPICALIS: NOT DETECTED
Candida parapsilosis: NOT DETECTED
ENTEROBACTER CLOACAE COMPLEX: NOT DETECTED
ENTEROBACTERIACEAE SPECIES: NOT DETECTED
ENTEROCOCCUS SPECIES: NOT DETECTED
ESCHERICHIA COLI: NOT DETECTED
Haemophilus influenzae: NOT DETECTED
KLEBSIELLA PNEUMONIAE: NOT DETECTED
Klebsiella oxytoca: NOT DETECTED
LISTERIA MONOCYTOGENES: NOT DETECTED
Methicillin resistance: NOT DETECTED
Neisseria meningitidis: NOT DETECTED
PROTEUS SPECIES: NOT DETECTED
Pseudomonas aeruginosa: NOT DETECTED
STAPHYLOCOCCUS SPECIES: DETECTED — AB
STREPTOCOCCUS AGALACTIAE: NOT DETECTED
STREPTOCOCCUS PYOGENES: NOT DETECTED
STREPTOCOCCUS SPECIES: NOT DETECTED
Serratia marcescens: NOT DETECTED
Staphylococcus aureus (BCID): NOT DETECTED
Streptococcus pneumoniae: NOT DETECTED

## 2015-11-21 LAB — TROPONIN I
TROPONIN I: 0.03 ng/mL — AB (ref <0.03)
TROPONIN I: 0.03 ng/mL — AB (ref <0.03)

## 2015-11-21 LAB — LIPID PANEL
CHOL/HDL RATIO: 5.4 ratio
CHOLESTEROL: 129 mg/dL (ref 0–200)
HDL: 24 mg/dL — ABNORMAL LOW (ref >40)
LDL Cholesterol: 81 mg/dL (ref 0–99)
Triglycerides: 122 mg/dL (ref <150)
VLDL: 24 mg/dL (ref 0–40)

## 2015-11-21 LAB — BASIC METABOLIC PANEL
ANION GAP: 8 (ref 5–15)
BUN: 19 mg/dL (ref 6–20)
CHLORIDE: 100 mmol/L — AB (ref 101–111)
CO2: 22 mmol/L (ref 22–32)
CREATININE: 0.89 mg/dL (ref 0.44–1.00)
Calcium: 8.1 mg/dL — ABNORMAL LOW (ref 8.9–10.3)
GFR calc non Af Amer: 58 mL/min — ABNORMAL LOW (ref >60)
GLUCOSE: 97 mg/dL (ref 65–99)
Potassium: 3.5 mmol/L (ref 3.5–5.1)
Sodium: 130 mmol/L — ABNORMAL LOW (ref 135–145)

## 2015-11-21 LAB — HIV ANTIBODY (ROUTINE TESTING W REFLEX): HIV Screen 4th Generation wRfx: NONREACTIVE

## 2015-11-21 LAB — ECHOCARDIOGRAM COMPLETE
Height: 65 in
Weight: 1728.41 oz

## 2015-11-21 LAB — CK TOTAL AND CKMB (NOT AT ARMC)
CK TOTAL: 667 U/L — AB (ref 38–234)
CK, MB: 24.8 ng/mL — ABNORMAL HIGH (ref 0.5–5.0)
Relative Index: 3.7 — ABNORMAL HIGH (ref 0.0–2.5)

## 2015-11-21 LAB — CK: CK TOTAL: 511 U/L — AB (ref 38–234)

## 2015-11-21 LAB — TSH: TSH: 1.237 u[IU]/mL (ref 0.350–4.500)

## 2015-11-21 LAB — STREP PNEUMONIAE URINARY ANTIGEN: STREP PNEUMO URINARY ANTIGEN: NEGATIVE

## 2015-11-21 MED ORDER — METOPROLOL TARTRATE 5 MG/5ML IV SOLN
INTRAVENOUS | Status: AC
  Filled 2015-11-21: qty 5

## 2015-11-21 MED ORDER — ACYCLOVIR 5 % EX CREA
TOPICAL_CREAM | Freq: Every day | CUTANEOUS | Status: DC
  Filled 2015-11-21: qty 5

## 2015-11-21 MED ORDER — GUAIFENESIN ER 600 MG PO TB12
600.0000 mg | ORAL_TABLET | Freq: Two times a day (BID) | ORAL | Status: DC
Start: 2015-11-21 — End: 2015-11-27
  Administered 2015-11-21 – 2015-11-27 (×13): 600 mg via ORAL
  Filled 2015-11-21 (×13): qty 1

## 2015-11-21 MED ORDER — METOPROLOL TARTRATE 5 MG/5ML IV SOLN
2.5000 mg | INTRAVENOUS | Status: AC | PRN
  Administered 2015-11-21 (×2): 2.5 mg via INTRAVENOUS
  Filled 2015-11-21 (×2): qty 5

## 2015-11-21 MED ORDER — GUAIFENESIN 100 MG/5ML PO SOLN
5.0000 mL | ORAL | Status: DC | PRN
  Administered 2015-11-21 (×2): 100 mg via ORAL
  Filled 2015-11-21 (×2): qty 10

## 2015-11-21 MED ORDER — ACYCLOVIR 5 % EX CREA
TOPICAL_CREAM | CUTANEOUS | Status: DC
  Administered 2015-11-21 – 2015-11-23 (×9): via TOPICAL

## 2015-11-21 MED ORDER — METOPROLOL TARTRATE 5 MG/5ML IV SOLN
2.5000 mg | INTRAVENOUS | Status: DC | PRN
  Administered 2015-11-21: 5 mg via INTRAVENOUS

## 2015-11-21 NOTE — Progress Notes (Signed)
PHARMACY - PHYSICIAN COMMUNICATION CRITICAL VALUE ALERT - BLOOD CULTURE IDENTIFICATION (BCID)  Results for orders placed or performed during the hospital encounter of 11/20/15  Blood Culture ID Panel (Reflexed) (Collected: 11/20/2015  4:13 PM)  Result Value Ref Range   Enterococcus species NOT DETECTED NOT DETECTED   Listeria monocytogenes NOT DETECTED NOT DETECTED   Staphylococcus species DETECTED (A) NOT DETECTED   Staphylococcus aureus NOT DETECTED NOT DETECTED   Methicillin resistance NOT DETECTED NOT DETECTED   Streptococcus species NOT DETECTED NOT DETECTED   Streptococcus agalactiae NOT DETECTED NOT DETECTED   Streptococcus pneumoniae NOT DETECTED NOT DETECTED   Streptococcus pyogenes NOT DETECTED NOT DETECTED   Acinetobacter baumannii NOT DETECTED NOT DETECTED   Enterobacteriaceae species NOT DETECTED NOT DETECTED   Enterobacter cloacae complex NOT DETECTED NOT DETECTED   Escherichia coli NOT DETECTED NOT DETECTED   Klebsiella oxytoca NOT DETECTED NOT DETECTED   Klebsiella pneumoniae NOT DETECTED NOT DETECTED   Proteus species NOT DETECTED NOT DETECTED   Serratia marcescens NOT DETECTED NOT DETECTED   Haemophilus influenzae NOT DETECTED NOT DETECTED   Neisseria meningitidis NOT DETECTED NOT DETECTED   Pseudomonas aeruginosa NOT DETECTED NOT DETECTED   Candida albicans NOT DETECTED NOT DETECTED   Candida glabrata NOT DETECTED NOT DETECTED   Candida krusei NOT DETECTED NOT DETECTED   Candida parapsilosis NOT DETECTED NOT DETECTED   Candida tropicalis NOT DETECTED NOT DETECTED    Name of physician (or Provider) Contacted: Irwin Brakeman, MD  Changes to prescribed antibiotics required: none, continue Rocephin & Azithromycin as ordered  Graylin Shiver L 11/21/2015  3:21 PM

## 2015-11-21 NOTE — Evaluation (Signed)
Physical Therapy Evaluation Patient Details Name: Heather [name] MRN: [id number] DOB: November 04, 1929 Today's Date: 11/21/2015   History of Present Illness  82 y/o female admitted through ED with cough, weakness and SOB  Clinical Impression  Pt admitted as above and presenting with functional mobility limitations 2* generalized weakness, ltd endurance and ambulatory balance deficits.  Pt would benefit from follow up rehab at SNF level to maximize IND and safety prior to return home with ltd assist.    Follow Up Recommendations SNF    Equipment Recommendations  None recommended by PT    Recommendations for Other Services OT consult     Precautions / Restrictions Precautions Precautions: Fall Restrictions Weight Bearing Restrictions: No      Mobility  Bed Mobility Overal bed mobility: Needs Assistance Bed Mobility: Supine to Sit           General bed mobility comments: Increased time, min assist and use of bed rails with HOB elevated to 40 degrees  Transfers Overall transfer level: Needs assistance Equipment used: Rolling walker (2 wheeled) Transfers: Sit to/from Stand Sit to Stand: Min assist         General transfer comment: cues for transition position and use of UEs to self assist.  Physical assist to bring wt up and fwd and to steady in standing  Ambulation/Gait Ambulation/Gait assistance: Min assist;Mod assist Ambulation Distance (Feet): 50 Feet (twice) Assistive device: Rolling walker (2 wheeled) Gait Pattern/deviations: Step-through pattern;Decreased step length - right;Decreased step length - left;Shuffle;Narrow base of support;Staggering left     General Gait Details: cues for posture and position from RW.  Physical assist to correct for multiple losses of balance bkwd and to L  Stairs            Wheelchair Mobility    Modified Rankin (Stroke Patients Only)       Balance Overall balance assessment: Needs assistance Sitting-balance support: No  upper extremity supported;Feet supported Sitting balance-Leahy Scale: Good     Standing balance support: Bilateral upper extremity supported Standing balance-Leahy Scale: Poor                               Pertinent Vitals/Pain Pain Assessment: No/denies pain    Home Living Family/patient expects to be discharged to:: Private residence Living Arrangements: Alone Available Help at Discharge: Personal care attendant;Available PRN/intermittently Type of Home: House       Home Layout: One level Home Equipment: [place] - 2 wheels;Wheelchair - manual Additional Comments: Lives alone with assist M-F for 4 hrs    Prior Function Level of Independence: Needs assistance   Gait / Transfers Assistance Needed: Pt denies use of AD  ADL's / Homemaking Assistance Needed: Aide 20 hrs a week        Hand Dominance        Extremity/Trunk Assessment   Upper Extremity Assessment: Generalized weakness           Lower Extremity Assessment: Generalized weakness      Cervical / Trunk Assessment: Kyphotic  Communication   Communication: No difficulties  Cognition Arousal/Alertness: Awake/alert Behavior During Therapy: WFL for tasks assessed/performed Overall Cognitive Status: Within Functional Limits for tasks assessed                      General Comments      Exercises General Exercises - Lower Extremity Ankle Circles/Pumps: AROM;Both;15 reps;Supine   Assessment/Plan    PT Assessment  Patient needs continued PT services  PT Problem List Decreased strength;Decreased range of motion;Decreased activity tolerance;Decreased balance;Decreased mobility;Decreased knowledge of use of DME;Decreased safety awareness          PT Treatment Interventions DME instruction;Gait training;Functional mobility training;Therapeutic activities;Therapeutic exercise;Patient/family education    PT Goals (Current goals can be found in the Care Plan section)  Acute Rehab PT  Goals Patient Stated Goal: Regain IND and go home PT Goal Formulation: With patient Time For Goal Achievement: 12/05/15 Potential to Achieve Goals: Fair    Frequency Min 3X/week   Barriers to discharge Decreased caregiver support      Co-evaluation               End of Session Equipment Utilized During Treatment: Gait belt;Oxygen Activity Tolerance: Patient limited by fatigue Patient left: in chair;with call bell/phone within reach;with chair alarm set Nurse Communication: Mobility status         Time: [id number] PT Time Calculation (min) (ACUTE ONLY): 33 min   Charges:   PT Evaluation $PT Eval Low Complexity: 1 Procedure PT Treatments $Gait Training: 8-22 mins   PT G Codes:        Daylin Eads 29-Nov-2015, 5:51 PM

## 2015-11-21 NOTE — Progress Notes (Signed)
CMT notified pt's HR in 140's. Pt asymptomatic, no complaints of chest pain, other vitals signs stable. PRN lopressor given. MD made aware. Will continue to monitor.

## 2015-11-21 NOTE — Consult Note (Signed)
Name: Heather [name] MRN: [id number] DOB: 15-Nov-1929    ADMISSION DATE:  11/20/2015 CONSULTATION DATE:  11/21/2015  REFERRING MD :  Wynetta Emery, [facility]  CHIEF COMPLAINT:  Shortness of breath and productive cough  HISTORY OF PRESENT ILLNESS:  82 year old with prior history of bronchiectasis and remote history of MAC infection, living independently at home admitted 9/15 after being found down on the floor. She reported generalized weakness for 3 days with a productive cough and mild shortness of breath. ED evaluation showed a mild lactic acidosis with leukocytosis and chest x-ray showed bilateral infiltrates-treated as CAP. Due to positive d-dimer a CT angiogram was performed which showed markedly progression of bronchiectasis and thick-walled cavitary lesions in the right upper and lower lobe there was also peripheral consolidation in the lingula and left lung. Hence we're consulted  Chart review shows that she followed with Dr. Joya Gaskins in 2013 for recurrent MAC infection and was on triple therapy every other day but seems to have been lost to follow-up since then. Surprisingly patient does not recall ever being told about this diagnosis. Review of imaging shows infiltrates and bronchiectasis in the same areas back in 2013-but markedly worsening on the current CT  She also developed SVT on 9/15 requiring Lopressor and again today this morning-she was asymptomatic without chest pain She continues to report a productive cough with yellow sputum   SIGNIFICANT EVENTS  9/15 SVT-given metoprolol and Coreg started, negative troponin  STUDIES:  CT angiogram 9/15 >>markedly progression of bronchiectasis and thick-walled cavitary lesions in the right upper and lower lobe there was also peripheral consolidation in the lingula and left lung Echo 9/16     PAST MEDICAL HISTORY :   has a past medical history of ANA positive (2007); Bronchiectasis; Dry eyes; Fibroids (uterine); Hemoptysis; Hiatal hernia;  Osteopenia; 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  has a past surgical history that includes Dilation and curettage, diagnostic / therapeutic; Colonoscopy (2005); and cataract surgery. Prior to Admission medications   Not on File   Allergies  Allergen Reactions  . Azo [Phenazopyridine]     Unknown reaction   . Ciprofloxacin Other (See Comments)    Dizzy     FAMILY HISTORY:  family history includes Cancer in her maternal aunt; Heart failure in her mother; Osteoporosis in her father and mother; Stroke in her father. SOCIAL HISTORY:  reports that she quit smoking about 50 years ago. Her smoking use included Cigarettes. She has a 5.40 pack-year smoking history. She has never used smokeless tobacco. She reports that she does not drink alcohol or use drugs.  REVIEW OF SYSTEMS:   Constitutional: Negative for fever, chills, weight loss, malaise/fatigue and diaphoresis.  HENT: Negative for hearing loss, ear pain, nosebleeds, congestion, sore throat, neck pain, tinnitus and ear discharge.   Eyes: Negative for blurred vision, double vision, photophobia, pain, discharge and redness.  Respiratory: Negative for  hemoptysis,  wheezing and stridor.   Cardiovascular: Negative for chest pain, palpitations, orthopnea, claudication, leg swelling and PND.  Gastrointestinal: Negative for heartburn, nausea, vomiting, abdominal pain, diarrhea, constipation, blood in stool and melena.  Genitourinary: Negative for dysuria, urgency, frequency, hematuria and flank pain.  Musculoskeletal: Negative for myalgias, back pain, joint pain and falls.  Skin: Negative for itching and rash.  Neurological: Negative for dizziness, tingling, tremors, sensory change, speech change, focal weakness, seizures, loss of consciousness, weakness and headaches.  Endo/Heme/Allergies: Negative for environmental allergies and polydipsia.  Does not bruise/bleed easily.  SUBJECTIVE:   VITAL SIGNS: Temp:  [97.9 F (36.6 C)-98.3 F (36.8 C)] 98.1 F (36.7 C) (09/16 0519) Pulse Rate:  [81-155] 145 (09/16 1045) Resp:  [16-32] 22 (09/16 1045) BP: (107-136)/(49-84) 111/69 (09/16 1045) SpO2:  [93 %-100 %] 100 % (09/16 1045) Weight:  [107 lb 5.8 oz (48.7 kg)-115 lb (52.2 kg)] 108 lb 0.4 oz (49 kg) (09/16 0519)  PHYSICAL EXAMINATION: Gen. Pleasant, elderly, thin in no distress, normal affect ENT - no lesions, no post nasal drip Neck: No JVD, no thyromegaly, no carotid bruits Lungs: no use of accessory muscles, no dullness to percussion, bibasal coarse crackles without rhonchi Cardiovascular: Rhythm irregular, tachy s1s2, no murmurs or gallops, no peripheral edema Abdomen: soft and non-tender, no hepatosplenomegaly, BS normal. Musculoskeletal: No deformities, no cyanosis or clubbing Neuro:  alert, non focal    Recent Labs Lab 11/20/15 1608 11/21/15 0241  NA 131* 130*  K 4.1 3.5  CL 97* 100*  CO2 21* 22  BUN 21* 19  CREATININE 0.91 0.89  GLUCOSE 114* 97    Recent Labs Lab 11/20/15 1608 11/21/15 0241  HGB 12.3 10.5*  HCT 39.0 32.6*  WBC 16.8* 16.4*  PLT 444* 400   Dg Chest 2 View  Result Date: 11/20/2015 CLINICAL DATA:  Cough EXAM: CHEST  2 VIEW COMPARISON:  12/23/2012 FINDINGS: The heart size is normal. Aortic atherosclerosis and tortuosity is identified. There is mild interstitial edema in a small left pleural effusion. Airspace opacities are identified within the right midlung and right upper lobe and left lower lobe. Chronic interstitial changes of COPD/emphysema noted. IMPRESSION: 1. Suspect mild CHF. 2. Bilateral multifocal airspace opacities are identified compatible with pneumonia. Followup PA and lateral chest X-ray is recommended in 3-4 weeks following trial of antibiotic therapy to ensure resolution and exclude underlying malignancy. Electronically Signed   By: Kerby Moors M.D.   On: 11/20/2015 16:58    Ct Angio Chest Pe W Or Wo Contrast  Result Date: 11/20/2015 CLINICAL DATA:  Elevated D-dimer. Tachycardia. Chest pain. Shortness of breath. EXAM: CT ANGIOGRAPHY CHEST WITH CONTRAST TECHNIQUE: Multidetector CT imaging of the chest was performed using the standard protocol during bolus administration of intravenous contrast. Multiplanar CT image reconstructions and MIPs were obtained to evaluate the vascular anatomy. CONTRAST:  100 cc Isovue 370 IV COMPARISON:  Chest radiograph earlier this day. High-resolution chest CT 06/02/2010, no interval imaging FINDINGS: Cardiovascular: There are no filling defects within the pulmonary arteries to suggest pulmonary embolus. The thoracic aorta is normal in caliber with mild atherosclerosis. A few coronary artery calcifications are seen. Mitral valvular calcifications are seen. Mediastinum/Nodes: Enlarged subcarinal lymph node measures 1.7 cm. Soft tissue density at the right hila without well-defined noted. An upper paratracheal node measures 10 mm. There is scattered esophageal wall thickening. No pericardial effusion. Lungs/Pleura: Cylindrical bronchiectasis within all lobes of both lungs has progressed from the prior exam. There is complete volume loss in the right middle lobe with whole lobe atelectasis, minimal aerated lung paramediastinal. Development of peripheral cavitary lesions in the right upper and lower lobes. In the right upper lobe cavitary process is complex, measuring at least 6.9 x 2.8 cm to encompass multiple adjacent cavitary densities. These lesions have a thick wall. More superiorly is subpleural cavitary formation with thick wall in the medial apex. In the lower lobe, cavitary process measures at least 3.3 x 2.5 cm with probable small fluid level. There fluid soft tissue density within the right lower lobe cavitary  lesions. Bronchiectasis in the left lung is less severe, most prominent in the lingula and central lower lobe. There are peripheral  consolidations in the left lower lobe and lingula appear confluent. Ground-glass and nodular opacities with tree in bud opacities are seen throughout the lungs. There is a small left pleural effusion. Multifocal right pleural thickening without discrete effusion. No definite septal thickening suggest pulmonary edema. Upper Abdomen: No acute abnormality. Musculoskeletal: Exaggerated thoracic kyphosis. There are no acute or suspicious osseous abnormalities. Review of the MIP images confirms the above findings. IMPRESSION: 1. No pulmonary embolus. 2. Marked progression since 2012 of bronchiectasis primarily in the right lung, with near complete atelectasis of the right middle lobe. Development of thick-walled cavitary lesions in the right upper and lower lobe which are contiguous with bronchiectasis. There is fluid and soft tissue density within the lower lobe cavitary lesions. Findings may be due to progressive infection versus malignancy. 3. Peripheral consolidations in the lingula and left lower lobe may be part of the background process versus acute pneumonia. These results will be called to the ordering clinician or representative by the Radiologist Assistant, and communication documented in the PACS or zVision Dashboard. Electronically Signed   By: Jeb Levering M.D.   On: 11/20/2015 23:48    ASSESSMENT / PLAN:  Bronchiectasis with thick-walled cavity right upper and lower lobe and peripheral consolidation in left lung - imaging is very suggestive for progressive MAC infection, cannot rule out superimposed acute bacterial infection Very reasonable to treat for CAP Obtain sputum AFB stain and culture -if stain is positive, will need ID consult to reintroduce treatment for MAC Surprisingly patient does not recall this diagnosis If AFB and sputum is negative then may need a bronchoscopy to further define.  SVT, appears to be atrial fibrillation- asymptomatic We'll use Lopressor for rate control, if  not successful within next 3-6 hours then will need Cardizem drip-defer to hospitalist Not a great candidate for long-term antibiotic regulation based on recurrent falls but may need short-term heparin Troponin negative, await echo   Kara Mead MD. FCCP. [facility] Pager [phone] If no response call [phone]    11/21/2015, 11:32 AM

## 2015-11-21 NOTE — Progress Notes (Signed)
Patient HR was sustaining in the 130s. Asymptomatic upon assessment. NP on call made aware and order for lopressor given and administered. Patient immediately converted back to sinus rhythm

## 2015-11-21 NOTE — Progress Notes (Signed)
PROGRESS NOTE    ANASTASYA WHITEHILL  [id number]  DOB: August 14, 1929  DOA: 11/20/2015 PCP: Unice Cobble, MD Outpatient Specialists:   Hospital course:  SUNDI KUEHL is a 82 y.o. independent female on no medication with history of bronchiectasis and remote history of MAC lung infection who apparently has not been followed closely by her primary physician for several years  was brought into the emergency department from home with cough, weakness and shortness of breath. She had been fairly independent prior to arrival and lives alone.  She was found on floor at home.  Pt diagnosed with CAP.  She subsequently develop episode of chest pain and SVT.    Assessment & Plan:   1. Severe sepsis secondary to commmunity acquired pneumonia-continue sepsis protocol with IV hydration and supportive therapy. Follow blood cultures. Follow sputum cultures. Lactic acid is normal now. 2. Community acquired pneumonia-patient empirically started on Rocephin and Zithromax IV. Sputum cultures ordered. Follow blood cultures. Continue respiratory support. 3. SVT - resolved with beta blocker administration, monitor on telemetry for recurrence. Cardiac enzymes trending.  4. Hyponatremia-likely secondary to dehydration-suspect this will improve with IV fluid hydration.  Following BMP.  5. Leukocytosis-secondary to sepsis and pneumonia-repeat CBC in the morning. 6. Sepsis associated Elevated lactic acid-repeat within 6 hours after fluid hydration. 7. Fall with Generalized weakness-PT/OT evaluation warranted when medically stabilized.  Pt adamant that she wants to return home to live independently but this is to be determined. 8. Bronchiectasis with history of MAC infection-patient has not followed up recently with pulmonology. Consider getting them involved if she is not rapidly improving with current treatment.  CT chest done reveals marked worsening of her condition.  9. Severe protein calorie malnutrition-consult dietitian  for recommendations. 10. History of recurrent falls-as above will consult PT when stabilized. 11. Mild congestive heart failure with acute exacerbation-elevated BNP noted, clinically will need to fluid resuscitate given severe sepsis and elevated lactic acid levels, as lactic acid levels improve will rapidly cut back on fluid resuscitation, monitor fluid intake and output closely, monitor weights, diuresis as needed. Also trending cardiac troponin.  Check 2-D echocardiogram. 12. Found on floor-Elevated CK levels noted, treating with IVF hydration.    DVT Prophylaxis: enoxaparin Code Status: full  Disposition Plan: TBD  Consultants:  pulmonary  Procedures:  CTA  Antimicrobials: Anti-infectives    Start     Dose/Rate Route Frequency Ordered Stop   11/21/15 1700  azithromycin (ZITHROMAX) 500 mg in dextrose 5 % 250 mL IVPB     500 mg 250 mL/hr over 60 Minutes Intravenous Every 24 hours 11/20/15 2004 11/28/15 1659   11/21/15 1600  cefTRIAXone (ROCEPHIN) 1 g in dextrose 5 % 50 mL IVPB     1 g 100 mL/hr over 30 Minutes Intravenous Every 24 hours 11/20/15 2004 11/28/15 1559   11/20/15 1715  cefTRIAXone (ROCEPHIN) 1 g in dextrose 5 % 50 mL IVPB     1 g 100 mL/hr over 30 Minutes Intravenous  Once 11/20/15 1711 11/20/15 1747   11/20/15 1715  azithromycin (ZITHROMAX) 500 mg in dextrose 5 % 250 mL IVPB     500 mg 250 mL/hr over 60 Minutes Intravenous  Once 11/20/15 1711 11/20/15 1854   11/20/15 1645  vancomycin (VANCOCIN) IVPB 1000 mg/200 mL premix  Status:  Discontinued     1,000 mg 200 mL/hr over 60 Minutes Intravenous  Once 11/20/15 1633 11/20/15 1633        Subjective: Pt reports no complaint but says she  still has persistent productive cough with yellowish sputum.   Objective: Vitals:   11/20/15 1958 11/20/15 2050 11/21/15 0006 11/21/15 0519  BP: 136/63 107/68  (!) 112/49  Pulse: 83 (!) 155 86 81  Resp: (!) 26   (!) 30  Temp: 97.9 F (36.6 C)   98.1 F (36.7 C)  TempSrc:  Oral   Oral  SpO2:  95%  93%  Weight: 48.7 kg (107 lb 5.8 oz)   49 kg (108 lb 0.4 oz)  Height: 5\' 5"  (1.651 m)       Intake/Output Summary (Last 24 hours) at 11/21/15 0820 Last data filed at 11/21/15 0506  Gross per 24 hour  Intake           101.25 ml  Output              700 ml  Net          -598.75 ml   Filed Weights   11/20/15 1625 11/20/15 1958 11/21/15 0519  Weight: 52.2 kg (115 lb) 48.7 kg (107 lb 5.8 oz) 49 kg (108 lb 0.4 oz)    Exam:   General exam: cachectic elderly patient, appears younger than stated age, in no obvious distress.  Head, eyes and ENT: Nontraumatic and normocephalic. Pupils equally reacting to light and accommodation. Oral mucosa very dry.  Neck: Supple. No JVD, carotid bruit or thyromegaly.  Lymphatics: No lymphadenopathy.  Respiratory system: diminished at both bases with rhonchi and scattered crackles.  No increased work of breathing.  Cardiovascular system: S1 and S2 heard, tachycardic. No JVD, murmurs, gallops, clicks or pedal edema.  Gastrointestinal system: Abdomen is nondistended, soft and nontender. Normal bowel sounds heard. No organomegaly or masses appreciated.  Central nervous system: Alert and oriented. No focal neurological deficits.  Extremities: Symmetric 5 x 5 power. Peripheral pulses symmetrically felt.   Skin: No rashes or acute findings.  Musculoskeletal system: Negative exam.  Psychiatry: Pleasant and cooperative.  Data Reviewed: Basic Metabolic Panel:  Recent Labs Lab 11/20/15 1608 11/21/15 0241  NA 131* 130*  K 4.1 3.5  CL 97* 100*  CO2 21* 22  GLUCOSE 114* 97  BUN 21* 19  CREATININE 0.91 0.89  CALCIUM 8.9 8.1*   Liver Function Tests:  Recent Labs Lab 11/20/15 1608  AST 44*  ALT 18  ALKPHOS 96  BILITOT 1.0  PROT 8.7*  ALBUMIN 3.1*   No results for input(s): LIPASE, AMYLASE in the last 168 hours. No results for input(s): AMMONIA in the last 168 hours. CBC:  Recent Labs Lab 11/20/15 1608  11/21/15 0241  WBC 16.8* 16.4*  NEUTROABS 15.0* 13.9*  HGB 12.3 10.5*  HCT 39.0 32.6*  MCV 79.1 76.7*  PLT 444* 400   Cardiac Enzymes:  Recent Labs Lab 11/20/15 2030 11/20/15 2106 11/20/15 2110 11/21/15 0241  CKTOTAL 683*  --  667* 511*  CKMB  --   --  24.8*  --   TROPONINI  --  0.03*  --  0.03*   CBG (last 3)  No results for input(s): GLUCAP in the last 72 hours. No results found for this or any previous visit (from the past 240 hour(s)).   Studies: Dg Chest 2 View  Result Date: 11/20/2015 CLINICAL DATA:  Cough EXAM: CHEST  2 VIEW COMPARISON:  12/23/2012 FINDINGS: The heart size is normal. Aortic atherosclerosis and tortuosity is identified. There is mild interstitial edema in a small left pleural effusion. Airspace opacities are identified within the right midlung and right upper lobe  and left lower lobe. Chronic interstitial changes of COPD/emphysema noted. IMPRESSION: 1. Suspect mild CHF. 2. Bilateral multifocal airspace opacities are identified compatible with pneumonia. Followup PA and lateral chest X-ray is recommended in 3-4 weeks following trial of antibiotic therapy to ensure resolution and exclude underlying malignancy. Electronically Signed   By: Kerby Moors M.D.   On: 11/20/2015 16:58   Ct Angio Chest Pe W Or Wo Contrast  Result Date: 11/20/2015 CLINICAL DATA:  Elevated D-dimer. Tachycardia. Chest pain. Shortness of breath. EXAM: CT ANGIOGRAPHY CHEST WITH CONTRAST TECHNIQUE: Multidetector CT imaging of the chest was performed using the standard protocol during bolus administration of intravenous contrast. Multiplanar CT image reconstructions and MIPs were obtained to evaluate the vascular anatomy. CONTRAST:  100 cc Isovue 370 IV COMPARISON:  Chest radiograph earlier this day. High-resolution chest CT 06/02/2010, no interval imaging FINDINGS: Cardiovascular: There are no filling defects within the pulmonary arteries to suggest pulmonary embolus. The thoracic aorta is  normal in caliber with mild atherosclerosis. A few coronary artery calcifications are seen. Mitral valvular calcifications are seen. Mediastinum/Nodes: Enlarged subcarinal lymph node measures 1.7 cm. Soft tissue density at the right hila without well-defined noted. An upper paratracheal node measures 10 mm. There is scattered esophageal wall thickening. No pericardial effusion. Lungs/Pleura: Cylindrical bronchiectasis within all lobes of both lungs has progressed from the prior exam. There is complete volume loss in the right middle lobe with whole lobe atelectasis, minimal aerated lung paramediastinal. Development of peripheral cavitary lesions in the right upper and lower lobes. In the right upper lobe cavitary process is complex, measuring at least 6.9 x 2.8 cm to encompass multiple adjacent cavitary densities. These lesions have a thick wall. More superiorly is subpleural cavitary formation with thick wall in the medial apex. In the lower lobe, cavitary process measures at least 3.3 x 2.5 cm with probable small fluid level. There fluid soft tissue density within the right lower lobe cavitary lesions. Bronchiectasis in the left lung is less severe, most prominent in the lingula and central lower lobe. There are peripheral consolidations in the left lower lobe and lingula appear confluent. Ground-glass and nodular opacities with tree in bud opacities are seen throughout the lungs. There is a small left pleural effusion. Multifocal right pleural thickening without discrete effusion. No definite septal thickening suggest pulmonary edema. Upper Abdomen: No acute abnormality. Musculoskeletal: Exaggerated thoracic kyphosis. There are no acute or suspicious osseous abnormalities. Review of the MIP images confirms the above findings. IMPRESSION: 1. No pulmonary embolus. 2. Marked progression since 2012 of bronchiectasis primarily in the right lung, with near complete atelectasis of the right middle lobe. Development of  thick-walled cavitary lesions in the right upper and lower lobe which are contiguous with bronchiectasis. There is fluid and soft tissue density within the lower lobe cavitary lesions. Findings may be due to progressive infection versus malignancy. 3. Peripheral consolidations in the lingula and left lower lobe may be part of the background process versus acute pneumonia. These results will be called to the ordering clinician or representative by the Radiologist Assistant, and communication documented in the PACS or zVision Dashboard. Electronically Signed   By: Jeb Levering M.D.   On: 11/20/2015 23:48   Scheduled Meds: . aspirin EC  325 mg Oral Daily  . atorvastatin  80 mg Oral q1800  . azithromycin  500 mg Intravenous Q24H  . carvedilol  3.125 mg Oral BID WC  . cefTRIAXone (ROCEPHIN)  IV  1 g Intravenous Q24H  . enoxaparin (  LOVENOX) injection  40 mg Subcutaneous Q24H  . guaiFENesin  600 mg Oral BID  . ramipril  1.25 mg Oral BID  . sodium chloride flush  3 mL Intravenous Q12H   Continuous Infusions:   Principal Problem:   Sepsis ([place]) Active Problems:   CAP (community acquired pneumonia)   Mild CHF exacerbation   Elevated lactic acid level   Severe dehydration   Paroxysmal SVT (supraventricular tachycardia) (HCC)   MAC (mycobacterium avium-intracellulare complex)   RAYNAUD'S DISEASE   VOCAL CORD DISORDER   FTT (failure to thrive) in adult   Protein-calorie malnutrition, severe (HCC)   Recurrent falls   Non-compliant behavior   Leukocytosis   Generalized weakness   Productive cough   Bronchiectasis (HCC)   Hyponatremia   Chest pain  Time spent:   Irwin Brakeman, MD, FAAFP [facility] Pager [phone]  If 7PM-7AM, please contact night-coverage [email or web address] Password [email or web address] 11/21/2015, 8:20 AM    LOS: 1 day

## 2015-11-21 NOTE — Progress Notes (Signed)
MD Elsworth Soho at bedside made aware of pt's sustained HR in 140s. Ordered EKG and additional 2.5 mg of lopressor. Orders completed. Will continue to monitor.

## 2015-11-22 DIAGNOSIS — R05 Cough: Secondary | ICD-10-CM

## 2015-11-22 DIAGNOSIS — I5033 Acute on chronic diastolic (congestive) heart failure: Secondary | ICD-10-CM

## 2015-11-22 DIAGNOSIS — E43 Unspecified severe protein-calorie malnutrition: Secondary | ICD-10-CM

## 2015-11-22 DIAGNOSIS — R296 Repeated falls: Secondary | ICD-10-CM

## 2015-11-22 LAB — CBC WITH DIFFERENTIAL/PLATELET
BASOS ABS: 0 10*3/uL (ref 0.0–0.1)
Basophils Relative: 0 %
EOS PCT: 2 %
Eosinophils Absolute: 0.2 10*3/uL (ref 0.0–0.7)
HEMATOCRIT: 30.1 % — AB (ref 36.0–46.0)
Hemoglobin: 9.3 g/dL — ABNORMAL LOW (ref 12.0–15.0)
LYMPHS ABS: 1.2 10*3/uL (ref 0.7–4.0)
LYMPHS PCT: 9 %
MCH: 24.3 pg — AB (ref 26.0–34.0)
MCHC: 30.9 g/dL (ref 30.0–36.0)
MCV: 78.6 fL (ref 78.0–100.0)
Monocytes Absolute: 1.2 10*3/uL — ABNORMAL HIGH (ref 0.1–1.0)
Monocytes Relative: 9 %
NEUTROS ABS: 10.4 10*3/uL — AB (ref 1.7–7.7)
Neutrophils Relative %: 80 %
PLATELETS: 455 10*3/uL — AB (ref 150–400)
RBC: 3.83 MIL/uL — AB (ref 3.87–5.11)
RDW: 16.5 % — ABNORMAL HIGH (ref 11.5–15.5)
WBC: 13.1 10*3/uL — AB (ref 4.0–10.5)

## 2015-11-22 LAB — BASIC METABOLIC PANEL
ANION GAP: 6 (ref 5–15)
BUN: 15 mg/dL (ref 6–20)
CHLORIDE: 106 mmol/L (ref 101–111)
CO2: 22 mmol/L (ref 22–32)
Calcium: 7.8 mg/dL — ABNORMAL LOW (ref 8.9–10.3)
Creatinine, Ser: 0.7 mg/dL (ref 0.44–1.00)
GFR calc Af Amer: >60 mL/min (ref >60)
GFR calc non Af Amer: >60 mL/min (ref >60)
GLUCOSE: 94 mg/dL (ref 65–99)
POTASSIUM: 3.8 mmol/L (ref 3.5–5.1)
Sodium: 134 mmol/L — ABNORMAL LOW (ref 135–145)

## 2015-11-22 LAB — URINE CULTURE: Culture: NO GROWTH

## 2015-11-22 LAB — HEMOGLOBIN A1C
Hgb A1c MFr Bld: 5.4 % (ref 4.8–5.6)
Mean Plasma Glucose: 108 mg/dL

## 2015-11-22 LAB — CK: CK TOTAL: 189 U/L (ref 38–234)

## 2015-11-22 MED ORDER — ENSURE ENLIVE PO LIQD
237.0000 mL | Freq: Two times a day (BID) | ORAL | Status: DC
  Administered 2015-11-23 – 2015-11-27 (×5): 237 mL via ORAL

## 2015-11-22 MED ORDER — SODIUM CHLORIDE 0.9 % IV SOLN
INTRAVENOUS | Status: AC
  Administered 2015-11-22: 01:00:00 via INTRAVENOUS

## 2015-11-22 NOTE — Progress Notes (Signed)
PROGRESS NOTE    Heather [name]  [id number]  DOB: [birth year]  DOA: 11/20/2015 PCP: Unice Cobble, MD Outpatient Specialists:  Hospital course:  Heather [name] is a 82 y.o. independent female on no medication with history of bronchiectasis and remote history of MAC lung infection who apparently has not been followed closely by her primary physician for several years  was brought into the emergency department from home with cough, weakness and shortness of breath. She had been fairly independent prior to arrival and lives alone.  She was found on floor at home.  Pt diagnosed with CAP.  She subsequently develop episode of chest pain and SVT.    Assessment & Plan:   1. Severe sepsis secondary to presumed commmunity acquired pneumonia-continue sepsis protocol with IV hydration and supportive therapy. Follow blood cultures. Follow sputum cultures. Lactic acid is normal now. 2. Community acquired pneumonia-Given abnormal CT chest there is concern about progressive MAC infection, consulted pulmonologist, AFB smears pending; patient empirically started on Rocephin and Zithromax IV for CAP. Sputum cultures ordered. Following blood cultures. Continue respiratory support. 3. SVT / ?paroxysmal AFIB - resolved with beta blocker administration, now on coreg 3.125 BID, monitor on telemetry for recurrence. Pt poor candidate for chronic anticoagulation.  Cardiac enzymes trending.  Echo with diastolic dysfunction but good systolic function.  No severe valvular abnormalities.  4. Hyponatremia-likely secondary to dehydration-suspect this will improve with IV fluid hydration.  Following BMP.  5. Leukocytosis-secondary to sepsis and pneumonia-repeat CBC in the morning. 6. Hypotension - Pt had some soft BPs overnight but improved with IVF hydration.  She still has fairly poor oral intake.   7. Elevated lactic acid - improved rapidly with hydration.  8. Fall with Generalized weakness-PT recommending SNF which I am in  agreement with.  Pt adamant that she wants to return home to live independently but I do not believe that it is safe for her to do so. 9. Severe progressive Bronchiectasis with history of MAC infection-patient has not followed up recently with pulmonology.  Appreciate pulmonary consult with Dr. Elsworth Soho.  May need to restart MAC therapy.  CT chest done reveals marked worsening of her condition.  10. Severe protein calorie malnutrition-consult dietitian for recommendations. 11. History of recurrent falls-as above PT recommending SNF. 12. Stable chronic diastolic congestive heart failure - Pt appears fairly compensated at this time, she is very frail and having what I suspect to be episodes of paroxysmal Afib.    13. Found on floor-Elevated CK levels noted, CK trending down with IVF hydration. 14. Advance Directives:  I had a long conversation with patient 9/17 and she was very clear to me that she wanted to be DNR.  She did not want artificial resuscitation, intubation, mechanical ventilation, pressor support for blood pressure if she decompensated and preferred a natural death.   Will make patient DNR per her wishes.   DVT Prophylaxis: enoxaparin Code Status: full  Disposition Plan: Will need SNF placement   Consultants:  Pulmonary Elsworth Soho)  Procedures:  CTA  Antimicrobials: Anti-infectives    Start     Dose/Rate Route Frequency Ordered Stop   11/21/15 1700  azithromycin (ZITHROMAX) 500 mg in dextrose 5 % 250 mL IVPB     500 mg 250 mL/hr over 60 Minutes Intravenous Every 24 hours 11/20/15 2004 11/28/15 1659   11/21/15 1600  cefTRIAXone (ROCEPHIN) 1 g in dextrose 5 % 50 mL IVPB     1 g 100 mL/hr over 30 Minutes Intravenous Every 24  hours 11/20/15 2004 11/28/15 1559   11/20/15 1715  cefTRIAXone (ROCEPHIN) 1 g in dextrose 5 % 50 mL IVPB     1 g 100 mL/hr over 30 Minutes Intravenous  Once 11/20/15 1711 11/20/15 1747   11/20/15 1715  azithromycin (ZITHROMAX) 500 mg in dextrose 5 % 250 mL IVPB       500 mg 250 mL/hr over 60 Minutes Intravenous  Once 11/20/15 1711 11/20/15 1854   11/20/15 1645  vancomycin (VANCOCIN) IVPB 1000 mg/200 mL premix  Status:  Discontinued     1,000 mg 200 mL/hr over 60 Minutes Intravenous  Once 11/20/15 1633 11/20/15 1633       Subjective: Pt reports no complaint but says she still has persistent productive cough with yellowish sputum.   Objective: Vitals:   11/21/15 2100 11/22/15 0029 11/22/15 0308 11/22/15 0528  BP: (!) 82/41 (!) 87/44 (!) 102/50 124/77  Pulse: 75   76  Resp: (!) 25   (!) 34  Temp: 97.8 F (36.6 C)   98.1 F (36.7 C)  TempSrc: Oral   Oral  SpO2: 99%   94%  Weight:    49.3 kg (108 lb 11 oz)  Height:        Intake/Output Summary (Last 24 hours) at 11/22/15 0813 Last data filed at 11/22/15 0700  Gross per 24 hour  Intake          1348.75 ml  Output              875 ml  Net           473.75 ml   Filed Weights   11/20/15 1958 11/21/15 0519 11/22/15 0528  Weight: 48.7 kg (107 lb 5.8 oz) 49 kg (108 lb 0.4 oz) 49.3 kg (108 lb 11 oz)    Exam:   General exam: cachectic elderly patient, appears younger than stated age, in no obvious distress.  Head, eyes and ENT: Nontraumatic and normocephalic. Pupils equally reacting to light and accommodation. Oral mucosa very dry.  Neck: Supple. No JVD, carotid bruit or thyromegaly.  Lymphatics: No lymphadenopathy.  Respiratory system: diminished at both bases with rhonchi and scattered crackles.  No increased work of breathing.  Cardiovascular system: S1 and S2 heard, tachycardic. No JVD, murmurs, gallops, clicks or pedal edema.  Gastrointestinal system: Abdomen is nondistended, soft and nontender. Normal bowel sounds heard. No organomegaly or masses appreciated.  Central nervous system: Alert and oriented. No focal neurological deficits.  Extremities: Symmetric 5 x 5 power. Peripheral pulses symmetrically felt.   Skin: No rashes or acute findings.  Musculoskeletal system:  Negative exam.  Psychiatry: Pleasant and cooperative.  Data Reviewed: Basic Metabolic Panel:  Recent Labs Lab 11/20/15 1608 11/21/15 0241 11/22/15 0524  NA 131* 130* 134*  K 4.1 3.5 3.8  CL 97* 100* 106  CO2 21* 22 22  GLUCOSE 114* 97 94  BUN 21* 19 15  CREATININE 0.91 0.89 0.70  CALCIUM 8.9 8.1* 7.8*   Liver Function Tests:  Recent Labs Lab 11/20/15 1608  AST 44*  ALT 18  ALKPHOS 96  BILITOT 1.0  PROT 8.7*  ALBUMIN 3.1*   No results for input(s): LIPASE, AMYLASE in the last 168 hours. No results for input(s): AMMONIA in the last 168 hours. CBC:  Recent Labs Lab 11/20/15 1608 11/21/15 0241  WBC 16.8* 16.4*  NEUTROABS 15.0* 13.9*  HGB 12.3 10.5*  HCT 39.0 32.6*  MCV 79.1 76.7*  PLT 444* 400   Cardiac Enzymes:  Recent Labs  Lab 11/20/15 2030 11/20/15 2106 11/20/15 2110 11/21/15 0241 11/21/15 0840 11/22/15 0524  CKTOTAL 683*  --  667* 511*  --  189  CKMB  --   --  24.8*  --   --   --   TROPONINI  --  0.03*  --  0.03* 0.03*  --    CBG (last 3)  No results for input(s): GLUCAP in the last 72 hours. Recent Results (from the past 240 hour(s))  Blood Culture (routine x 2)     Status: None (Preliminary result)   Collection Time: 11/20/15  4:13 PM  Result Value Ref Range Status   Specimen Description BLOOD RIGHT ANTECUBITAL  Final   Special Requests BOTTLES DRAWN AEROBIC AND ANAEROBIC 5 CC EA  Final   Culture  Setup Time   Final    GRAM POSITIVE COCCI IN CLUSTERS IN BOTH AEROBIC AND ANAEROBIC BOTTLES CRITICAL RESULT CALLED TO, READ BACK BY AND VERIFIED WITH: T GREEN,PHARMD AT 1520 11/21/15 BY L BENFIELD Performed at [place]  Final   Report Status PENDING  Incomplete  Blood Culture ID Panel (Reflexed)     Status: Abnormal   Collection Time: 11/20/15  4:13 PM  Result Value Ref Range Status   Enterococcus species NOT DETECTED NOT DETECTED Final   Listeria monocytogenes NOT DETECTED NOT DETECTED Final    Staphylococcus species DETECTED (A) NOT DETECTED Final    Comment: CRITICAL RESULT CALLED TO, READ BACK BY AND VERIFIED WITH: T GREEN,PHARMD AT 1520 11/21/15 BY L BENFIELD    Staphylococcus aureus NOT DETECTED NOT DETECTED Final   Methicillin resistance NOT DETECTED NOT DETECTED Final   Streptococcus species NOT DETECTED NOT DETECTED Final   Streptococcus agalactiae NOT DETECTED NOT DETECTED Final   Streptococcus pneumoniae NOT DETECTED NOT DETECTED Final   Streptococcus pyogenes NOT DETECTED NOT DETECTED Final   Acinetobacter baumannii NOT DETECTED NOT DETECTED Final   Enterobacteriaceae species NOT DETECTED NOT DETECTED Final   Enterobacter cloacae complex NOT DETECTED NOT DETECTED Final   Escherichia coli NOT DETECTED NOT DETECTED Final   Klebsiella oxytoca NOT DETECTED NOT DETECTED Final   Klebsiella pneumoniae NOT DETECTED NOT DETECTED Final   Proteus species NOT DETECTED NOT DETECTED Final   Serratia marcescens NOT DETECTED NOT DETECTED Final   Haemophilus influenzae NOT DETECTED NOT DETECTED Final   Neisseria meningitidis NOT DETECTED NOT DETECTED Final   Pseudomonas aeruginosa NOT DETECTED NOT DETECTED Final   Candida albicans NOT DETECTED NOT DETECTED Final   Candida glabrata NOT DETECTED NOT DETECTED Final   Candida krusei NOT DETECTED NOT DETECTED Final   Candida parapsilosis NOT DETECTED NOT DETECTED Final   Candida tropicalis NOT DETECTED NOT DETECTED Final    Comment: Performed at [facility]  Blood Culture (routine x 2)     Status: None (Preliminary result)   Collection Time: 11/20/15  8:20 PM  Result Value Ref Range Status   Specimen Description BLOOD RIGHT ARM  Final   Special Requests BOTTLES DRAWN AEROBIC AND ANAEROBIC 6CC  Final   Culture   Final    NO GROWTH < 12 HOURS Performed at [facility]    Report Status PENDING  Incomplete  Culture, respiratory (NON-Expectorated)     Status: None (Preliminary result)   Collection Time: 11/21/15 12:35 PM   Result Value Ref Range Status   Specimen Description TRACHEAL ASPIRATE  Final   Special Requests NONE  Final   Gram Stain  Final    NO WBC SEEN FEW SQUAMOUS EPITHELIAL CELLS PRESENT FEW GRAM POSITIVE COCCI IN PAIRS RARE GRAM VARIABLE ROD Performed at [facility]    Culture PENDING  Incomplete   Report Status PENDING  Incomplete     Studies: Dg Chest 2 View  Result Date: 11/20/2015 CLINICAL DATA:  Cough EXAM: CHEST  2 VIEW COMPARISON:  12/23/2012 FINDINGS: The heart size is normal. Aortic atherosclerosis and tortuosity is identified. There is mild interstitial edema in a small left pleural effusion. Airspace opacities are identified within the right midlung and right upper lobe and left lower lobe. Chronic interstitial changes of COPD/emphysema noted. IMPRESSION: 1. Suspect mild CHF. 2. Bilateral multifocal airspace opacities are identified compatible with pneumonia. Followup PA and lateral chest X-ray is recommended in 3-4 weeks following trial of antibiotic therapy to ensure resolution and exclude underlying malignancy. Electronically Signed   By: Kerby Moors M.D.   On: 11/20/2015 16:58   Ct Angio Chest Pe W Or Wo Contrast  Result Date: 11/20/2015 CLINICAL DATA:  Elevated D-dimer. Tachycardia. Chest pain. Shortness of breath. EXAM: CT ANGIOGRAPHY CHEST WITH CONTRAST TECHNIQUE: Multidetector CT imaging of the chest was performed using the standard protocol during bolus administration of intravenous contrast. Multiplanar CT image reconstructions and MIPs were obtained to evaluate the vascular anatomy. CONTRAST:  100 cc Isovue 370 IV COMPARISON:  Chest radiograph earlier this day. High-resolution chest CT 06/02/2010, no interval imaging FINDINGS: Cardiovascular: There are no filling defects within the pulmonary arteries to suggest pulmonary embolus. The thoracic aorta is normal in caliber with mild atherosclerosis. A few coronary artery calcifications are seen. Mitral valvular  calcifications are seen. Mediastinum/Nodes: Enlarged subcarinal lymph node measures 1.7 cm. Soft tissue density at the right hila without well-defined noted. An upper paratracheal node measures 10 mm. There is scattered esophageal wall thickening. No pericardial effusion. Lungs/Pleura: Cylindrical bronchiectasis within all lobes of both lungs has progressed from the prior exam. There is complete volume loss in the right middle lobe with whole lobe atelectasis, minimal aerated lung paramediastinal. Development of peripheral cavitary lesions in the right upper and lower lobes. In the right upper lobe cavitary process is complex, measuring at least 6.9 x 2.8 cm to encompass multiple adjacent cavitary densities. These lesions have a thick wall. More superiorly is subpleural cavitary formation with thick wall in the medial apex. In the lower lobe, cavitary process measures at least 3.3 x 2.5 cm with probable small fluid level. There fluid soft tissue density within the right lower lobe cavitary lesions. Bronchiectasis in the left lung is less severe, most prominent in the lingula and central lower lobe. There are peripheral consolidations in the left lower lobe and lingula appear confluent. Ground-glass and nodular opacities with tree in bud opacities are seen throughout the lungs. There is a small left pleural effusion. Multifocal right pleural thickening without discrete effusion. No definite septal thickening suggest pulmonary edema. Upper Abdomen: No acute abnormality. Musculoskeletal: Exaggerated thoracic kyphosis. There are no acute or suspicious osseous abnormalities. Review of the MIP images confirms the above findings. IMPRESSION: 1. No pulmonary embolus. 2. Marked progression since 2012 of bronchiectasis primarily in the right lung, with near complete atelectasis of the right middle lobe. Development of thick-walled cavitary lesions in the right upper and lower lobe which are contiguous with bronchiectasis.  There is fluid and soft tissue density within the lower lobe cavitary lesions. Findings may be due to progressive infection versus malignancy. 3. Peripheral consolidations in the lingula and left  lower lobe may be part of the background process versus acute pneumonia. These results will be called to the ordering clinician or representative by the Radiologist Assistant, and communication documented in the PACS or zVision Dashboard. Electronically Signed   By: Jeb Levering M.D.   On: 11/20/2015 23:48   Scheduled Meds: . acyclovir cream   Topical Q4H  . aspirin EC  325 mg Oral Daily  . atorvastatin  80 mg Oral q1800  . azithromycin  500 mg Intravenous Q24H  . carvedilol  3.125 mg Oral BID WC  . cefTRIAXone (ROCEPHIN)  IV  1 g Intravenous Q24H  . enoxaparin (LOVENOX) injection  40 mg Subcutaneous Q24H  . guaiFENesin  600 mg Oral BID  . ramipril  1.25 mg Oral BID  . sodium chloride flush  3 mL Intravenous Q12H   Continuous Infusions: . sodium chloride 75 mL/hr at 11/22/15 0101    Principal Problem:   Sepsis ([place]) Active Problems:   CAP (community acquired pneumonia)   Mild CHF exacerbation   Elevated lactic acid level   Severe dehydration   Paroxysmal SVT (supraventricular tachycardia) (HCC)   MAC (mycobacterium avium-intracellulare complex)   RAYNAUD'S DISEASE   VOCAL CORD DISORDER   FTT (failure to thrive) in adult   Protein-calorie malnutrition, severe (HCC)   Recurrent falls   Non-compliant behavior   Leukocytosis   Generalized weakness   Productive cough   Bronchiectasis (HCC)   Hyponatremia   Chest pain  Time spent:   Irwin Brakeman, MD, FAAFP [facility] Pager [phone]  If 7PM-7AM, please contact night-coverage [email or web address] Password [email or web address] 11/22/2015, 8:13 AM    LOS: 2 days

## 2015-11-22 NOTE — Progress Notes (Signed)
Pt hadn't voided in over 8 hours; did a bladder scan which showed >587 in bladder. Encouraged and helped pt to [facility] to try to void to avoid an in-and-out catheter. Pt voided 475. Will continued to monitor patient and pass on to day shift nurse.  Carmela Hurt, RN 1:34 AM 11/22/15

## 2015-11-22 NOTE — Progress Notes (Signed)
Name: Heather [name] MRN: [id number] DOB: [birth year]    ADMISSION DATE:  11/20/2015 CONSULTATION DATE:  11/22/2015  REFERRING MD :  Wynetta Emery, [facility]  CHIEF COMPLAINT:  Shortness of breath and productive cough  HISTORY OF PRESENT ILLNESS:  82 year old with prior history of bronchiectasis and remote history of MAC infection, living independently at home admitted 9/15 after being found down on the floor. She reported generalized weakness for 3 days with a productive cough and mild shortness of breath. ED evaluation showed a mild lactic acidosis with leukocytosis and chest x-ray showed bilateral infiltrates-treated as CAP. Due to positive d-dimer a CT angiogram was performed which showed markedly progression of bronchiectasis and thick-walled cavitary lesions in the right upper and lower lobe there was also peripheral consolidation in the lingula and left lung. Hence we're consulted  Chart review shows that she followed with Dr. Joya Gaskins in 2013 for recurrent MAC infection and was on triple therapy every other day but seems to have been lost to follow-up since then. Surprisingly patient does not recall ever being told about this diagnosis. Review of imaging shows infiltrates and bronchiectasis in the same areas back in 2013-but markedly worsening on the current CT  She also developed SVT on 9/15 requiring Lopressor and again today this morning-she was asymptomatic without chest pain She continues to report a productive cough with yellow sputum   SIGNIFICANT EVENTS  9/15 SVT-given metoprolol and Coreg started, negative troponin  STUDIES:  CT angiogram 9/15 >>markedly progression of bronchiectasis and thick-walled cavitary lesions in the right upper and lower lobe there was also peripheral consolidation in the lingula and left lung Echo 9/16 nml LV fn    SUBJECTIVE: breathing ok Cough decreased Afebrile No palpitations  VITAL SIGNS: Temp:  [97.8 F (36.6 C)-98.2 F (36.8 C)] 98.1 F (36.7 C)  (09/17 0528) Pulse Rate:  [75-92] 76 (09/17 0528) Resp:  [20-34] 34 (09/17 0528) BP: (82-124)/(41-77) 124/77 (09/17 0528) SpO2:  [93 %-99 %] 94 % (09/17 0528) Weight:  [49.3 kg (108 lb 11 oz)] 49.3 kg (108 lb 11 oz) (09/17 0528)  PHYSICAL EXAMINATION: Gen. Pleasant, elderly, thin in no distress, normal affect ENT - no lesions, no post nasal drip Neck: No JVD, no thyromegaly, no carotid bruits Lungs: no use of accessory muscles, no dullness to percussion, bibasal coarse crackles without rhonchi Cardiovascular: Rhythm irregular, nml s1s2, no murmurs or gallops, no peripheral edema Abdomen: soft and non-tender, no hepatosplenomegaly, BS normal. Musculoskeletal: No deformities, no cyanosis or clubbing Neuro:  alert, non focal    Recent Labs Lab 11/20/15 1608 11/21/15 0241 11/22/15 0524  NA 131* 130* 134*  K 4.1 3.5 3.8  CL 97* 100* 106  CO2 21* 22 22  BUN 21* 19 15  CREATININE 0.91 0.89 0.70  GLUCOSE 114* 97 94    Recent Labs Lab 11/20/15 1608 11/21/15 0241 11/22/15 0520  HGB 12.3 10.5* 9.3*  HCT 39.0 32.6* 30.1*  WBC 16.8* 16.4* 13.1*  PLT 444* 400 455*   Dg Chest 2 View  Result Date: 11/20/2015 CLINICAL DATA:  Cough EXAM: CHEST  2 VIEW COMPARISON:  12/23/2012 FINDINGS: The heart size is normal. Aortic atherosclerosis and tortuosity is identified. There is mild interstitial edema in a small left pleural effusion. Airspace opacities are identified within the right midlung and right upper lobe and left lower lobe. Chronic interstitial changes of COPD/emphysema noted. IMPRESSION: 1. Suspect mild CHF. 2. Bilateral multifocal airspace opacities are identified compatible with pneumonia. Followup PA and lateral chest X-ray  is recommended in 3-4 weeks following trial of antibiotic therapy to ensure resolution and exclude underlying malignancy. Electronically Signed   By: Kerby Moors M.D.   On: 11/20/2015 16:58   Ct Angio Chest Pe W Or Wo Contrast  Result Date:  11/20/2015 CLINICAL DATA:  Elevated D-dimer. Tachycardia. Chest pain. Shortness of breath. EXAM: CT ANGIOGRAPHY CHEST WITH CONTRAST TECHNIQUE: Multidetector CT imaging of the chest was performed using the standard protocol during bolus administration of intravenous contrast. Multiplanar CT image reconstructions and MIPs were obtained to evaluate the vascular anatomy. CONTRAST:  100 cc Isovue 370 IV COMPARISON:  Chest radiograph earlier this day. High-resolution chest CT 06/02/2010, no interval imaging FINDINGS: Cardiovascular: There are no filling defects within the pulmonary arteries to suggest pulmonary embolus. The thoracic aorta is normal in caliber with mild atherosclerosis. A few coronary artery calcifications are seen. Mitral valvular calcifications are seen. Mediastinum/Nodes: Enlarged subcarinal lymph node measures 1.7 cm. Soft tissue density at the right hila without well-defined noted. An upper paratracheal node measures 10 mm. There is scattered esophageal wall thickening. No pericardial effusion. Lungs/Pleura: Cylindrical bronchiectasis within all lobes of both lungs has progressed from the prior exam. There is complete volume loss in the right middle lobe with whole lobe atelectasis, minimal aerated lung paramediastinal. Development of peripheral cavitary lesions in the right upper and lower lobes. In the right upper lobe cavitary process is complex, measuring at least 6.9 x 2.8 cm to encompass multiple adjacent cavitary densities. These lesions have a thick wall. More superiorly is subpleural cavitary formation with thick wall in the medial apex. In the lower lobe, cavitary process measures at least 3.3 x 2.5 cm with probable small fluid level. There fluid soft tissue density within the right lower lobe cavitary lesions. Bronchiectasis in the left lung is less severe, most prominent in the lingula and central lower lobe. There are peripheral consolidations in the left lower lobe and lingula appear  confluent. Ground-glass and nodular opacities with tree in bud opacities are seen throughout the lungs. There is a small left pleural effusion. Multifocal right pleural thickening without discrete effusion. No definite septal thickening suggest pulmonary edema. Upper Abdomen: No acute abnormality. Musculoskeletal: Exaggerated thoracic kyphosis. There are no acute or suspicious osseous abnormalities. Review of the MIP images confirms the above findings. IMPRESSION: 1. No pulmonary embolus. 2. Marked progression since 2012 of bronchiectasis primarily in the right lung, with near complete atelectasis of the right middle lobe. Development of thick-walled cavitary lesions in the right upper and lower lobe which are contiguous with bronchiectasis. There is fluid and soft tissue density within the lower lobe cavitary lesions. Findings may be due to progressive infection versus malignancy. 3. Peripheral consolidations in the lingula and left lower lobe may be part of the background process versus acute pneumonia. These results will be called to the ordering clinician or representative by the Radiologist Assistant, and communication documented in the PACS or zVision Dashboard. Electronically Signed   By: Jeb Levering M.D.   On: 11/20/2015 23:48    ASSESSMENT / PLAN:  Bronchiectasis with thick-walled cavity right upper and lower lobe and peripheral consolidation in left lung - imaging is very suggestive for progressive MAC infection, cannot rule out superimposed acute bacterial infection Very reasonable to treat for CAP  Await  sputum AFB stain and culture -if stain is positive, will need ID consult to reintroduce treatment for MAC Surprisingly patient does not recall this diagnosis If AFB and sputum is negative then may need a  bronchoscopy to further define.  SVT, appears to be atrial fibrillation- asymptomatic -ct  Lopressor for rate control Not a great candidate for long-term antibiotic regulation based  on recurrent falls but may need short-term heparin    Kara Mead MD. Shade Flood. [facility] Pager [phone] If no response call [phone]    11/22/2015, 11:33 AM

## 2015-11-22 NOTE — Progress Notes (Signed)
Assumed care of patient. Agree with previous RN assessment.  Heather [name]. Brigitte Pulse, RN

## 2015-11-22 NOTE — Progress Notes (Signed)
Initial Nutrition Assessment  DOCUMENTATION CODES:   Severe malnutrition in context of chronic illness, Underweight  INTERVENTION:   Provide Ensure Enlive po BID, each supplement provides 350 kcal and 20 grams of protein Encourage PO intake RD to continue to monitor  NUTRITION DIAGNOSIS:   Malnutrition related to chronic illness as evidenced by severe depletion of body fat, severe depletion of muscle mass.  GOAL:   Patient will meet greater than or equal to 90% of their needs  MONITOR:   PO intake, Supplement acceptance, Labs, Weight trends, I & O's  REASON FOR ASSESSMENT:   Consult Assessment of nutrition requirement/status  ASSESSMENT:   82 year old with prior history of bronchiectasis and remote history of MAC infection, living independently at home admitted 9/15 after being found down on the floor. She reported generalized weakness for 3 days with a productive cough and mild shortness of breath.  Patient reports good appetite. PO intakes have ranged from 25-50% of meals. RD noted poor dentition. Pt states she ate 1/2 of her eggs, bites of grits and a whole blueberry muffin this morning for breakfast. She is willing to try Ensure supplements, RD to order. Per weight history, no weight loss recently but pt has had progressive weight loss since 2015.  Nutrition-Focused physical exam completed. Findings are severe fat depletion, severe muscle depletion, and no edema.   Medications reviewed. Labs reviewed: Low Na  Diet Order:  Diet heart healthy/carb modified Room service appropriate? Yes; Fluid consistency: Thin  Skin:  Reviewed, no issues  Last BM:  9/14  Height:   Ht Readings from Last 1 Encounters:  11/20/15 5\' 5"  (1.651 m)    Weight:   Wt Readings from Last 1 Encounters:  11/22/15 108 lb 11 oz (49.3 kg)    Ideal Body Weight:  56.8 kg  BMI:  Body mass index is 18.09 kg/m.  Estimated Nutritional Needs:   Kcal:  1300-1500  Protein:   60-70g  Fluid:  1.5L/day  EDUCATION NEEDS:   No education needs identified at this time  Clayton Bibles, MS, RD, LDN Pager: [phone] After Hours Pager: [phone]

## 2015-11-23 DIAGNOSIS — E860 Dehydration: Secondary | ICD-10-CM

## 2015-11-23 DIAGNOSIS — J470 Bronchiectasis with acute lower respiratory infection: Secondary | ICD-10-CM

## 2015-11-23 LAB — BASIC METABOLIC PANEL
ANION GAP: 7 (ref 5–15)
BUN: 14 mg/dL (ref 6–20)
CALCIUM: 7.2 mg/dL — AB (ref 8.9–10.3)
CHLORIDE: 104 mmol/L (ref 101–111)
CO2: 22 mmol/L (ref 22–32)
CREATININE: 0.73 mg/dL (ref 0.44–1.00)
GFR calc Af Amer: >60 mL/min (ref >60)
GFR calc non Af Amer: >60 mL/min (ref >60)
GLUCOSE: 90 mg/dL (ref 65–99)
Potassium: 3.4 mmol/L — ABNORMAL LOW (ref 3.5–5.1)
Sodium: 133 mmol/L — ABNORMAL LOW (ref 135–145)

## 2015-11-23 LAB — CBC WITH DIFFERENTIAL/PLATELET
BASOS PCT: 0 %
Basophils Absolute: 0 10*3/uL (ref 0.0–0.1)
Eosinophils Absolute: 0.3 10*3/uL (ref 0.0–0.7)
Eosinophils Relative: 3 %
HEMATOCRIT: 27.5 % — AB (ref 36.0–46.0)
HEMOGLOBIN: 9 g/dL — AB (ref 12.0–15.0)
LYMPHS ABS: 1.3 10*3/uL (ref 0.7–4.0)
Lymphocytes Relative: 11 %
MCH: 24.9 pg — AB (ref 26.0–34.0)
MCHC: 32.7 g/dL (ref 30.0–36.0)
MCV: 76.2 fL — ABNORMAL LOW (ref 78.0–100.0)
MONO ABS: 1.4 10*3/uL — AB (ref 0.1–1.0)
MONOS PCT: 11 %
NEUTROS ABS: 9.6 10*3/uL — AB (ref 1.7–7.7)
NEUTROS PCT: 75 %
Platelets: 430 10*3/uL — ABNORMAL HIGH (ref 150–400)
RBC: 3.61 MIL/uL — ABNORMAL LOW (ref 3.87–5.11)
RDW: 16.3 % — AB (ref 11.5–15.5)
WBC: 12.7 10*3/uL — ABNORMAL HIGH (ref 4.0–10.5)

## 2015-11-23 LAB — LEGIONELLA PNEUMOPHILA SEROGP 1 UR AG: L. pneumophila Serogp 1 Ur Ag: NEGATIVE

## 2015-11-23 LAB — CULTURE, BLOOD (ROUTINE X 2)

## 2015-11-23 LAB — CULTURE, RESPIRATORY: GRAM STAIN: NONE SEEN

## 2015-11-23 LAB — ACID FAST SMEAR (AFB, MYCOBACTERIA)

## 2015-11-23 LAB — ACID FAST SMEAR (AFB): ACID FAST SMEAR - AFSCU2: NEGATIVE

## 2015-11-23 LAB — CULTURE, RESPIRATORY W GRAM STAIN: Culture: NORMAL

## 2015-11-23 MED ORDER — FUROSEMIDE 10 MG/ML IJ SOLN
20.0000 mg | Freq: Once | INTRAMUSCULAR | Status: AC
  Administered 2015-11-23: 20 mg via INTRAVENOUS
  Filled 2015-11-23: qty 2

## 2015-11-23 MED ORDER — POTASSIUM CHLORIDE CRYS ER 20 MEQ PO TBCR
20.0000 meq | EXTENDED_RELEASE_TABLET | Freq: Three times a day (TID) | ORAL | Status: AC
  Administered 2015-11-23 – 2015-11-24 (×4): 20 meq via ORAL
  Filled 2015-11-23 (×4): qty 1

## 2015-11-23 NOTE — Progress Notes (Signed)
Physical Therapy Treatment Patient Details Name: Heather [name] MRN: [id number] DOB: 1929/04/15 Today's Date: 11/23/2015    History of Present Illness 82 y/o female admitted through ED with cough, weakness and SOB    PT Comments    Progressing with mobility.   Follow Up Recommendations  SNF (If pt returns home, then HHPT and 24 hour supervision.)     Equipment Recommendations  None recommended by PT    Recommendations for Other Services       Precautions / Restrictions Precautions Precautions: Fall Restrictions Weight Bearing Restrictions: No    Mobility  Bed Mobility Overal bed mobility: Needs Assistance Bed Mobility: Supine to Sit;Sit to Supine     Supine to sit: Min guard;HOB elevated Sit to supine: Min guard;HOB elevated   General bed mobility comments: Increased time. close guard for safety.   Transfers Overall transfer level: Needs assistance Equipment used: Rolling walker (2 wheeled) Transfers: Sit to/from Stand Sit to Stand: Min assist         General transfer comment: Assist to rise, stabilize, control descent.   Ambulation/Gait Ambulation/Gait assistance: Min assist Ambulation Distance (Feet): 100 Feet Assistive device: Rolling walker (2 wheeled) Gait Pattern/deviations: Step-through pattern;Trunk flexed;Decreased stride length     General Gait Details: Assist to stabilize intermittently. Pt tolerated distance well.    Stairs            Wheelchair Mobility    Modified Rankin (Stroke Patients Only)       Balance                                    Cognition Arousal/Alertness: Awake/alert Behavior During Therapy: WFL for tasks assessed/performed Overall Cognitive Status: Within Functional Limits for tasks assessed                      Exercises      General Comments        Pertinent Vitals/Pain Pain Assessment: No/denies pain    Home Living                      Prior Function           PT Goals (current goals can now be found in the care plan section) Progress towards PT goals: Progressing toward goals    Frequency    Min 3X/week      PT Plan Current plan remains appropriate    Co-evaluation             End of Session Equipment Utilized During Treatment: Gait belt Activity Tolerance: Patient tolerated treatment well Patient left: in bed;with call bell/phone within reach;with bed alarm set     Time: 1457-1515 PT Time Calculation (min) (ACUTE ONLY): 18 min  Charges:                       G Codes:      Weston Anna, MPT Pager: [phone]

## 2015-11-23 NOTE — NC FL2 (Signed)
[place] LEVEL OF CARE SCREENING TOOL     IDENTIFICATION  Patient Name: Heather [name] Birthdate: 02/14/1930 Sex: female Admission Date (Current Location): 11/20/2015  [facility] and [place] Number:  Herbalist and Address:  [facility],  [place] [address], [place]      Provider Number: [id number]  Attending Physician Name and Address:  Murlean Iba, MD  Relative Name and Phone Number:       Current Level of Care: Hospital Recommended Level of Care: [place] Prior Approval Number:    Date Approved/Denied:   PASRR Number: [id number]  Discharge Plan: SNF    Current Diagnoses: Patient Active Problem List   Diagnosis Date Noted  . Paroxysmal SVT (supraventricular tachycardia) ([place]) 11/21/2015  . Sepsis ([place]) 11/20/2015  . CAP (community acquired pneumonia) 11/20/2015  . Leukocytosis 11/20/2015  . Generalized weakness 11/20/2015  . Productive cough 11/20/2015  . Bronchiectasis ([place]) 11/20/2015  . Mild CHF exacerbation 11/20/2015  . Hyponatremia 11/20/2015  . Elevated lactic acid level 11/20/2015  . Severe dehydration 11/20/2015  . Chest pain 11/20/2015  . Non-compliant behavior 10/29/2013  . DVT (deep venous thrombosis) ([place]) 10/26/2013  . Recurrent falls 09/18/2013  . Protein-calorie malnutrition, severe ([place]) 09/12/2013  . Sciatica 09/10/2013  . FTT (failure to thrive) in adult 09/10/2013  . Unspecified adverse effect of unspecified drug, medicinal and biological substance 08/19/2011  . Hyperlipidemia 12/01/2010  . VOCAL CORD DISORDER 04/27/2007  . HEMOPTYSIS 02/26/2007  . MAC (mycobacterium avium-intracellulare complex) 12/22/2006  . FIBROIDS, UTERUS 06/12/2006  . TREMOR, ESSENTIAL 06/12/2006  . RAYNAUD'S DISEASE 06/12/2006  . OSTEOPENIA 06/12/2006    Orientation RESPIRATION BLADDER Height & Weight     Self, Time, Situation, Place  O2 (2L) Incontinent Weight: 117 lb 1 oz (53.1 kg) Height:  5'  5" (165.1 cm)  BEHAVIORAL SYMPTOMS/MOOD NEUROLOGICAL BOWEL NUTRITION STATUS      Continent Diet (Carb Modified/Heart Healthy)  AMBULATORY STATUS COMMUNICATION OF NEEDS Skin   Extensive Assist Verbally Normal                       Personal Care Assistance Level of Assistance  Bathing, Dressing Bathing Assistance: Limited assistance   Dressing Assistance: Limited assistance     Functional Limitations Info             SPECIAL CARE FACTORS FREQUENCY  PT (By licensed PT), OT (By licensed OT)     PT Frequency: 5 OT Frequency: 5            Contractures      Additional Factors Info  Code Status, Allergies Code Status Info: DNR Allergies Info: Azo Phenazopyridine, Ciprofloxacin           Current Medications (11/23/2015):  This is the current hospital active medication list Current Facility-Administered Medications  Medication Dose Route Frequency Provider Last Rate Last Dose  . 0.9 %  sodium chloride infusion  250 mL Intravenous PRN Clanford Marisa Hua, MD      . acetaminophen (TYLENOL) tablet 650 mg  650 mg Oral Q4H PRN Clanford Marisa Hua, MD      . aspirin EC tablet 325 mg  325 mg Oral Daily Jani Gravel, MD   325 mg at 11/23/15 1057  . atorvastatin (LIPITOR) tablet 80 mg  80 mg Oral q1800 Jani Gravel, MD   80 mg at 11/22/15 1734  . azithromycin (ZITHROMAX) 500 mg in dextrose 5 % 250 mL IVPB  500  mg Intravenous Q24H Clanford Marisa Hua, MD   500 mg at 11/22/15 1734  . carvedilol (COREG) tablet 3.125 mg  3.125 mg Oral BID WC Jani Gravel, MD   3.125 mg at 11/23/15 0841  . cefTRIAXone (ROCEPHIN) 1 g in dextrose 5 % 50 mL IVPB  1 g Intravenous Q24H Clanford Marisa Hua, MD   1 g at 11/22/15 1635  . enoxaparin (LOVENOX) injection 40 mg  40 mg Subcutaneous Q24H Clanford Marisa Hua, MD   40 mg at 11/22/15 2110  . feeding supplement (ENSURE ENLIVE) (ENSURE ENLIVE) liquid 237 mL  237 mL Oral BID BM Clanford L Johnson, MD   237 mL at 11/23/15 1000  . guaiFENesin (MUCINEX) 12 hr tablet  600 mg  600 mg Oral BID Clanford Marisa Hua, MD   600 mg at 11/23/15 1057  . guaiFENesin (ROBITUSSIN) 100 MG/5ML solution 100 mg  5 mL Oral Q4H PRN Clanford Marisa Hua, MD   100 mg at 11/21/15 0509  . metoprolol (LOPRESSOR) injection 2.5-5 mg  2.5-5 mg Intravenous Q3H PRN Rigoberto Noel, MD   5 mg at 11/21/15 1121  . ondansetron (ZOFRAN) injection 4 mg  4 mg Intravenous Q6H PRN Clanford L Johnson, MD      . potassium chloride SA (K-DUR,KLOR-CON) CR tablet 20 mEq  20 mEq Oral TID Clanford Marisa Hua, MD   20 mEq at 11/23/15 1057  . ramipril (ALTACE) capsule 1.25 mg  1.25 mg Oral BID Clanford Marisa Hua, MD   1.25 mg at 11/23/15 1057  . sodium chloride flush (NS) 0.9 % injection 3 mL  3 mL Intravenous Q12H Clanford L Johnson, MD   3 mL at 11/23/15 1058  . sodium chloride flush (NS) 0.9 % injection 3 mL  3 mL Intravenous PRN Clanford Marisa Hua, MD         Discharge Medications: Please see discharge summary for a list of discharge medications.  Relevant Imaging Results:  Relevant Lab Results:   Additional Information SSN: [id number]  Standley Brooking, LCSW

## 2015-11-23 NOTE — Progress Notes (Signed)
PROGRESS NOTE    Heather [name]  [id number]  DOB: 24-Feb-1930  DOA: 11/20/2015 PCP: Unice Cobble, MD Outpatient Specialists:  Hospital course:  Heather [name] is a 82 y.o. independent female on no medication with history of bronchiectasis and remote history of MAC lung infection who apparently has not been followed closely by her primary physician for several years  was brought into the emergency department from home with cough, weakness and shortness of breath. She had been fairly independent prior to arrival and lives alone.  She was found on floor at home.  Pt diagnosed with CAP.  She subsequently develop episode of chest pain and SVT.    Assessment & Plan:   1. Severe sepsis secondary to presumed commmunity acquired pneumonia-continue sepsis protocol with IV hydration and supportive therapy. Follow blood cultures. Follow sputum cultures. Lactic acid is normal now.  Clinically patient is much improved.   2. Community acquired pneumonia-Given abnormal CT chest there is concern about progressive MAC infection, consulted pulmonologist, AFB smears pending; patient empirically started on Rocephin and Zithromax IV for CAP. Sputum cultures ordered. Following blood cultures. Continue respiratory support. 3. SVT / ?paroxysmal AFIB - resolved with beta blocker administration, now on coreg 3.125 BID, monitor on telemetry for recurrence. Pt poor candidate for chronic anticoagulation.  Cardiac enzymes trending.  Echo with diastolic dysfunction but good systolic function.  No severe valvular abnormalities.  4. Hyponatremia-likely secondary to dehydration-improved with hydration.  Following BMP.  5. Leukocytosis-secondary to sepsis and pneumonia-repeat CBC in the morning. 6. Hypotension - Pt had some soft BPs but improved with IVF hydration.  She still has fairly poor oral intake.   7. Elevated lactic acid - improved rapidly with hydration.  8. Fall with Generalized weakness-PT recommending SNF which I am  in agreement with.  Pt adamant that she wants to return home to live independently but I do not believe that it is safe for her to do so. 9. Severe progressive Bronchiectasis with history of MAC infection-Cultures still pending at this time.  Patient has not followed up recently with outpatient pulmonologist.  Appreciate pulmonary consult with Dr. Elsworth Soho.  May need to restart MAC therapy.  Will defer to pulmonology team.  CT chest done reveals marked worsening of her condition.  10. Severe protein calorie malnutrition-consulted dietitian for recommendations. Pt is eating her meals now.   11. History of recurrent falls-as above PT recommending SNF. 12. Stable chronic diastolic congestive heart failure - Pt has a few crackles on exam 9/18 - will give gentle IV lasix dose.   13. Found on floor-Elevated CK levels noted, CK trending down with IVF hydration. 14. Positive blood culture: I suspect that this was a contaminant: coag neg staph species.  15. Advance Directives:  I had a long conversation with patient 9/17 and she was very clear to me that she wanted to be DNR.  She did not want artificial resuscitation, intubation, mechanical ventilation, pressor support for blood pressure if she decompensated and preferred a natural death.   Will make patient DNR per her wishes.   DVT Prophylaxis: enoxaparin Code Status: full  Disposition Plan: Will likely  need SNF, anticipate discharge in 1-2 days  Consultants:  Pulmonary Elsworth Soho)  Procedures:  CTA  Antimicrobials: Anti-infectives    Start     Dose/Rate Route Frequency Ordered Stop   11/21/15 1700  azithromycin (ZITHROMAX) 500 mg in dextrose 5 % 250 mL IVPB     500 mg 250 mL/hr over 60 Minutes Intravenous  Every 24 hours 11/20/15 2004 11/28/15 1659   11/21/15 1600  cefTRIAXone (ROCEPHIN) 1 g in dextrose 5 % 50 mL IVPB     1 g 100 mL/hr over 30 Minutes Intravenous Every 24 hours 11/20/15 2004 11/28/15 1559   11/20/15 1715  cefTRIAXone (ROCEPHIN) 1 g  in dextrose 5 % 50 mL IVPB     1 g 100 mL/hr over 30 Minutes Intravenous  Once 11/20/15 1711 11/20/15 1747   11/20/15 1715  azithromycin (ZITHROMAX) 500 mg in dextrose 5 % 250 mL IVPB     500 mg 250 mL/hr over 60 Minutes Intravenous  Once 11/20/15 1711 11/20/15 1854   11/20/15 1645  vancomycin (VANCOCIN) IVPB 1000 mg/200 mL premix  Status:  Discontinued     1,000 mg 200 mL/hr over 60 Minutes Intravenous  Once 11/20/15 1633 11/20/15 1633       Subjective: Pt reports less productive cough today.   Overall feeling better.    Objective: Vitals:   11/22/15 1302 11/22/15 1636 11/22/15 2200 11/23/15 0610  BP: (!) 94/56 (!) 115/56 (!) 90/38 (!) 109/51  Pulse: 84  89 80  Resp: (!) 22  20 18   Temp: 98.4 F (36.9 C)  99.7 F (37.6 C) 99 F (37.2 C)  TempSrc: Oral  Oral Oral  SpO2: 98%  100% 98%  Weight:    53.1 kg (117 lb 1 oz)  Height:        Intake/Output Summary (Last 24 hours) at 11/23/15 [id number] Last data filed at 11/23/15 0800  Gross per 24 hour  Intake             1065 ml  Output              200 ml  Net              865 ml   Filed Weights   11/21/15 0519 11/22/15 0528 11/23/15 0610  Weight: 49 kg (108 lb 0.4 oz) 49.3 kg (108 lb 11 oz) 53.1 kg (117 lb 1 oz)    Exam:   General exam: cachectic elderly patient, appears younger than stated age, in no obvious distress.  Head, eyes and ENT: Nontraumatic and normocephalic. Pupils equally reacting to light and accommodation. Oral mucosa very dry.  Neck: Supple. No JVD, carotid bruit or thyromegaly.  Lymphatics: No lymphadenopathy.  Respiratory system: diminished at both bases with rhonchi and scattered crackles.  No increased work of breathing.  Cardiovascular system: S1 and S2 heard, tachycardic. No JVD, murmurs, gallops, clicks or pedal edema.  Gastrointestinal system: Abdomen is nondistended, soft and nontender. Normal bowel sounds heard. No organomegaly or masses appreciated.  Central nervous system: Alert and  oriented. No focal neurological deficits.  Extremities: Symmetric 5 x 5 power. Peripheral pulses symmetrically felt.   Skin: No rashes or acute findings.  Musculoskeletal system: Negative exam.  Psychiatry: Pleasant and cooperative.  Data Reviewed: Basic Metabolic Panel:  Recent Labs Lab 11/20/15 1608 11/21/15 0241 11/22/15 0524 11/23/15 0411  NA 131* 130* 134* 133*  K 4.1 3.5 3.8 3.4*  CL 97* 100* 106 104  CO2 21* 22 22 22   GLUCOSE 114* 97 94 90  BUN 21* 19 15 14   CREATININE 0.91 0.89 0.70 0.73  CALCIUM 8.9 8.1* 7.8* 7.2*   Liver Function Tests:  Recent Labs Lab 11/20/15 1608  AST 44*  ALT 18  ALKPHOS 96  BILITOT 1.0  PROT 8.7*  ALBUMIN 3.1*   No results for input(s): LIPASE, AMYLASE in the last  168 hours. No results for input(s): AMMONIA in the last 168 hours. CBC:  Recent Labs Lab 11/20/15 1608 11/21/15 0241 11/22/15 0520 11/23/15 0411  WBC 16.8* 16.4* 13.1* 12.7*  NEUTROABS 15.0* 13.9* 10.4* 9.6*  HGB 12.3 10.5* 9.3* 9.0*  HCT 39.0 32.6* 30.1* 27.5*  MCV 79.1 76.7* 78.6 76.2*  PLT 444* 400 455* 430*   Cardiac Enzymes:  Recent Labs Lab 11/20/15 2030 11/20/15 2106 11/20/15 2110 11/21/15 0241 11/21/15 0840 11/22/15 0524  CKTOTAL 683*  --  667* 511*  --  189  CKMB  --   --  24.8*  --   --   --   TROPONINI  --  0.03*  --  0.03* 0.03*  --    CBG (last 3)  No results for input(s): GLUCAP in the last 72 hours. Recent Results (from the past 240 hour(s))  Urine culture     Status: None   Collection Time: 11/20/15  3:41 PM  Result Value Ref Range Status   Specimen Description URINE, RANDOM  Final   Special Requests NONE  Final   Culture NO GROWTH Performed at [facility]   Final   Report Status 11/22/2015 FINAL  Final  Blood Culture (routine x 2)     Status: Abnormal (Preliminary result)   Collection Time: 11/20/15  4:13 PM  Result Value Ref Range Status   Specimen Description BLOOD RIGHT ANTECUBITAL  Final   Special Requests  BOTTLES DRAWN AEROBIC AND ANAEROBIC 5 CC EA  Final   Culture  Setup Time   Final    GRAM POSITIVE COCCI IN CLUSTERS IN BOTH AEROBIC AND ANAEROBIC BOTTLES CRITICAL RESULT CALLED TO, READ BACK BY AND VERIFIED WITH: T GREEN,PHARMD AT 1520 11/21/15 BY L BENFIELD Performed at [place] (COAGULASE NEGATIVE) (A)  Final   Report Status PENDING  Incomplete  Blood Culture ID Panel (Reflexed)     Status: Abnormal   Collection Time: 11/20/15  4:13 PM  Result Value Ref Range Status   Enterococcus species NOT DETECTED NOT DETECTED Final   Listeria monocytogenes NOT DETECTED NOT DETECTED Final   Staphylococcus species DETECTED (A) NOT DETECTED Final    Comment: CRITICAL RESULT CALLED TO, READ BACK BY AND VERIFIED WITH: T GREEN,PHARMD AT 1520 11/21/15 BY L BENFIELD    Staphylococcus aureus NOT DETECTED NOT DETECTED Final   Methicillin resistance NOT DETECTED NOT DETECTED Final   Streptococcus species NOT DETECTED NOT DETECTED Final   Streptococcus agalactiae NOT DETECTED NOT DETECTED Final   Streptococcus pneumoniae NOT DETECTED NOT DETECTED Final   Streptococcus pyogenes NOT DETECTED NOT DETECTED Final   Acinetobacter baumannii NOT DETECTED NOT DETECTED Final   Enterobacteriaceae species NOT DETECTED NOT DETECTED Final   Enterobacter cloacae complex NOT DETECTED NOT DETECTED Final   Escherichia coli NOT DETECTED NOT DETECTED Final   Klebsiella oxytoca NOT DETECTED NOT DETECTED Final   Klebsiella pneumoniae NOT DETECTED NOT DETECTED Final   Proteus species NOT DETECTED NOT DETECTED Final   Serratia marcescens NOT DETECTED NOT DETECTED Final   Haemophilus influenzae NOT DETECTED NOT DETECTED Final   Neisseria meningitidis NOT DETECTED NOT DETECTED Final   Pseudomonas aeruginosa NOT DETECTED NOT DETECTED Final   Candida albicans NOT DETECTED NOT DETECTED Final   Candida glabrata NOT DETECTED NOT DETECTED Final   Candida krusei NOT DETECTED NOT DETECTED  Final   Candida parapsilosis NOT DETECTED NOT DETECTED Final   Candida tropicalis NOT DETECTED NOT DETECTED Final  Comment: Performed at [facility]  Blood Culture (routine x 2)     Status: None (Preliminary result)   Collection Time: 11/20/15  8:20 PM  Result Value Ref Range Status   Specimen Description BLOOD RIGHT ARM  Final   Special Requests BOTTLES DRAWN AEROBIC AND ANAEROBIC [place]  Final   Culture   Final    NO GROWTH 2 DAYS Performed at [facility]    Report Status PENDING  Incomplete  Culture, respiratory (NON-Expectorated)     Status: None (Preliminary result)   Collection Time: 11/21/15 12:35 PM  Result Value Ref Range Status   Specimen Description TRACHEAL ASPIRATE  Final   Special Requests NONE  Final   Gram Stain   Final    NO WBC SEEN FEW SQUAMOUS EPITHELIAL CELLS PRESENT FEW GRAM POSITIVE COCCI IN PAIRS RARE GRAM VARIABLE ROD    Culture   Final    CULTURE REINCUBATED FOR BETTER GROWTH Performed at [facility]    Report Status PENDING  Incomplete    Studies: No results found. Scheduled Meds: . aspirin EC  325 mg Oral Daily  . atorvastatin  80 mg Oral q1800  . azithromycin  500 mg Intravenous Q24H  . carvedilol  3.125 mg Oral BID WC  . cefTRIAXone (ROCEPHIN)  IV  1 g Intravenous Q24H  . enoxaparin (LOVENOX) injection  40 mg Subcutaneous Q24H  . feeding supplement (ENSURE ENLIVE)  237 mL Oral BID BM  . furosemide  20 mg Intravenous Once  . guaiFENesin  600 mg Oral BID  . potassium chloride  20 mEq Oral TID  . ramipril  1.25 mg Oral BID  . sodium chloride flush  3 mL Intravenous Q12H   Continuous Infusions:   Principal Problem:   Sepsis ([place]) Active Problems:   CAP (community acquired pneumonia)   Mild CHF exacerbation   Elevated lactic acid level   Severe dehydration   Paroxysmal SVT (supraventricular tachycardia) (HCC)   MAC (mycobacterium avium-intracellulare complex)   RAYNAUD'S DISEASE   VOCAL CORD DISORDER   FTT  (failure to thrive) in adult   Protein-calorie malnutrition, severe (HCC)   Recurrent falls   Non-compliant behavior   Leukocytosis   Generalized weakness   Productive cough   Bronchiectasis (HCC)   Hyponatremia   Chest pain  Time spent:   Irwin Brakeman, MD, FAAFP [facility] Pager [phone]  If 7PM-7AM, please contact night-coverage [email or web address] Password [email or web address] 11/23/2015, 8:33 AM    LOS: 3 days

## 2015-11-23 NOTE — Clinical Social Work Note (Signed)
Clinical Social Work Assessment  Patient Details  Name: Heather [name] MRN: [id number] Date of Birth: 07/12/1929  Date of referral:  11/23/15               Reason for consult:  Facility Placement                Permission sought to share information with:  Chartered certified accountant granted to share information::  Yes, Verbal Permission Granted  Name::        Agency::     Relationship::     Contact Information:     Housing/Transportation Living arrangements for the past 2 months:  Single Family Home Source of Information:  Patient Patient Interpreter Needed:  None Criminal Activity/Legal Involvement Pertinent to Current Situation/Hospitalization:  No - Comment as needed Significant Relationships:  Adult Children Lives with:  Self Do you feel safe going back to the place where you live?  No Need for family participation in patient care:  No (Coment)  Care giving concerns:  CSW reviewed PT evaluation recommending SNF at discharge.    Social Worker assessment / plan:  CSW met with patient who states that she is agreeable with plan for rehab, had been to [facility] in the past and would prefer to return there if possible. Patient states that she has a caregiver at her home daily from 3-11pm but would feel more comfortable going to rehab before returning home.   Employment status:  Retired Forensic scientist:  Medicare PT Recommendations:  [place] / Referral to community resources:  [place]  Patient/Family's Response to care:  CSW awaiting call back from [place] at [facility] re: bed availability.   Patient/Family's Understanding of and Emotional Response to Diagnosis, Current Treatment, and Prognosis:   Emotional Assessment Appearance:  Appears stated age Attitude/Demeanor/Rapport:    Affect (typically observed):    Orientation:  Oriented to Self, Oriented to Place, Oriented to  Time, Oriented to Situation Alcohol  / Substance use:    Psych involvement (Current and /or in the community):     Discharge Needs  Concerns to be addressed:    Readmission within the last 30 days:    Current discharge risk:    Barriers to Discharge:      Standley Brooking, LCSW 11/23/2015, 2:26 PM

## 2015-11-23 NOTE — Progress Notes (Signed)
Name: Heather [name] MRN: [id number] DOB: 01-24-1930    ADMISSION DATE:  11/20/2015 CONSULTATION DATE:  11/23/2015  REFERRING MD :  Wynetta Emery, triad  CHIEF COMPLAINT:  Shortness of breath and productive cough  BRIEF  82 year old with prior history of bronchiectasis and remote history of MAC infection, living independently at home admitted 9/15 after being found down on the floor. She reported generalized weakness for 3 days with a productive cough and mild shortness of breath. ED evaluation showed a mild lactic acidosis with leukocytosis and chest x-ray showed bilateral infiltrates-treated as CAP. Due to positive d-dimer a CT angiogram was performed which showed markedly progression of bronchiectasis and thick-walled cavitary lesions in the right upper and lower lobe there was also peripheral consolidation in the lingula and left lung. Hence we're consulted  Chart review shows that she followed with Dr. Joya Gaskins in 2013 for recurrent MAC infection and was on triple therapy every other day but seems to have been lost to follow-up since then. Surprisingly patient does not recall ever being told about this diagnosis. Review of imaging shows infiltrates and bronchiectasis in the same areas back in 2013-but markedly worsening on the current CT  She also developed SVT on 9/15 requiring Lopressor and again today this morning-she was asymptomatic without chest pain She continues to report a productive cough with yellow sputum   SIGNIFICANT EVENTS  9/15 SVT-given metoprolol and Coreg started, negative troponin  CT angiogram 9/15 >>markedly progression of bronchiectasis and thick-walled cavitary lesions in the right upper and lower lobe there was also peripheral consolidation in the lingula and left lung Echo 9/16 nml LV fn  11/22/15:  breathing ok, Cough decreased, Afebrile, No palpitations   SUBJECTIVE/OVERNIGHT/INTERVAL HX 11/23/15: no famiy at bedside. Patient denies active complaitns. Says she gave  sputum for testing.  VITAL SIGNS: Temp:  [98.5 F (36.9 C)-99.7 F (37.6 C)] 98.5 F (36.9 C) (09/18 1315) Pulse Rate:  [80-94] 94 (09/18 1315) Resp:  [18-20] 20 (09/18 1315) BP: (90-115)/(38-56) 100/54 (09/18 1315) SpO2:  [95 %-100 %] 95 % (09/18 1421) Weight:  [53.1 kg (117 lb 1 oz)] 53.1 kg (117 lb 1 oz) (09/18 0610)  PHYSICAL EXAMINATION: Gen. Pleasant, elderly, thin in no distress, normal affect ENT - no lesions, no post nasal drip Neck: No JVD, no thyromegaly, no carotid bruits Lungs: no use of accessory muscles, no dullness to percussion, bibasal coarse crackles without rhonchi Cardiovascular: Rhythm irregular, nml s1s2, no murmurs or gallops, no peripheral edema Abdomen: soft and non-tender, no hepatosplenomegaly, BS normal. Musculoskeletal: No deformities, no cyanosis or clubbing Neuro:  alert, non focal   PULMONARY No results for input(s): PHART, PCO2ART, PO2ART, HCO3, TCO2, O2SAT in the last 168 hours.  Invalid input(s): PCO2, PO2  CBC  Recent Labs Lab 11/21/15 0241 11/22/15 0520 11/23/15 0411  HGB 10.5* 9.3* 9.0*  HCT 32.6* 30.1* 27.5*  WBC 16.4* 13.1* 12.7*  PLT 400 455* 430*    COAGULATION No results for input(s): INR in the last 168 hours.  CARDIAC   Recent Labs Lab 11/20/15 2106 11/21/15 0241 11/21/15 0840  TROPONINI 0.03* 0.03* 0.03*   No results for input(s): PROBNP in the last 168 hours.   CHEMISTRY  Recent Labs Lab 11/20/15 1608 11/21/15 0241 11/22/15 0524 11/23/15 0411  NA 131* 130* 134* 133*  K 4.1 3.5 3.8 3.4*  CL 97* 100* 106 104  CO2 21* 22 22 22   GLUCOSE 114* 97 94 90  BUN 21* 19 15 14   CREATININE 0.91 0.89  0.70 0.73  CALCIUM 8.9 8.1* 7.8* 7.2*   Estimated Creatinine Clearance: 43.9 mL/min (by C-G formula based on SCr of 0.73 mg/dL).   LIVER  Recent Labs Lab 11/20/15 1608  AST 44*  ALT 18  ALKPHOS 96  BILITOT 1.0  PROT 8.7*  ALBUMIN 3.1*     INFECTIOUS  Recent Labs Lab 11/20/15 1615 11/20/15 1855   LATICACIDVEN 2.05* 1.09     ENDOCRINE CBG (last 3)  No results for input(s): GLUCAP in the last 72 hours.       IMAGING x48h  - image(s) personally visualized  -   highlighted in bold No results found.   MICRO Results for orders placed or performed during the hospital encounter of 11/20/15  Urine culture     Status: None   Collection Time: 11/20/15  3:41 PM  Result Value Ref Range Status   Specimen Description URINE, RANDOM  Final   Special Requests NONE  Final   Culture NO GROWTH Performed at [facility]   Final   Report Status 11/22/2015 FINAL  Final  Blood Culture (routine x 2)     Status: Abnormal   Collection Time: 11/20/15  4:13 PM  Result Value Ref Range Status   Specimen Description BLOOD RIGHT ANTECUBITAL  Final   Special Requests BOTTLES DRAWN AEROBIC AND ANAEROBIC 5 CC EA  Final   Culture  Setup Time   Final    GRAM POSITIVE COCCI IN CLUSTERS IN BOTH AEROBIC AND ANAEROBIC BOTTLES CRITICAL RESULT CALLED TO, READ BACK BY AND VERIFIED WITH: T GREEN,PHARMD AT 1520 11/21/15 BY L BENFIELD    Culture (A)  Final    STAPHYLOCOCCUS SPECIES (COAGULASE NEGATIVE) THE SIGNIFICANCE OF ISOLATING THIS ORGANISM FROM A SINGLE SET OF BLOOD CULTURES WHEN MULTIPLE SETS ARE DRAWN IS UNCERTAIN. PLEASE NOTIFY THE MICROBIOLOGY DEPARTMENT WITHIN ONE WEEK IF SPECIATION AND SENSITIVITIES ARE REQUIRED. Performed at [facility]    Report Status 11/23/2015 FINAL  Final  Blood Culture ID Panel (Reflexed)     Status: Abnormal   Collection Time: 11/20/15  4:13 PM  Result Value Ref Range Status   Enterococcus species NOT DETECTED NOT DETECTED Final   Listeria monocytogenes NOT DETECTED NOT DETECTED Final   Staphylococcus species DETECTED (A) NOT DETECTED Final    Comment: CRITICAL RESULT CALLED TO, READ BACK BY AND VERIFIED WITH: T GREEN,PHARMD AT 1520 11/21/15 BY L BENFIELD    Staphylococcus aureus NOT DETECTED NOT DETECTED Final   Methicillin resistance NOT DETECTED NOT  DETECTED Final   Streptococcus species NOT DETECTED NOT DETECTED Final   Streptococcus agalactiae NOT DETECTED NOT DETECTED Final   Streptococcus pneumoniae NOT DETECTED NOT DETECTED Final   Streptococcus pyogenes NOT DETECTED NOT DETECTED Final   Acinetobacter baumannii NOT DETECTED NOT DETECTED Final   Enterobacteriaceae species NOT DETECTED NOT DETECTED Final   Enterobacter cloacae complex NOT DETECTED NOT DETECTED Final   Escherichia coli NOT DETECTED NOT DETECTED Final   Klebsiella oxytoca NOT DETECTED NOT DETECTED Final   Klebsiella pneumoniae NOT DETECTED NOT DETECTED Final   Proteus species NOT DETECTED NOT DETECTED Final   Serratia marcescens NOT DETECTED NOT DETECTED Final   Haemophilus influenzae NOT DETECTED NOT DETECTED Final   Neisseria meningitidis NOT DETECTED NOT DETECTED Final   Pseudomonas aeruginosa NOT DETECTED NOT DETECTED Final   Candida albicans NOT DETECTED NOT DETECTED Final   Candida glabrata NOT DETECTED NOT DETECTED Final   Candida krusei NOT DETECTED NOT DETECTED Final   Candida parapsilosis NOT  DETECTED NOT DETECTED Final   Candida tropicalis NOT DETECTED NOT DETECTED Final    Comment: Performed at [facility]  Blood Culture (routine x 2)     Status: None (Preliminary result)   Collection Time: 11/20/15  8:20 PM  Result Value Ref Range Status   Specimen Description BLOOD RIGHT ARM  Final   Special Requests BOTTLES DRAWN AEROBIC AND ANAEROBIC 6CC  Final   Culture   Final    NO GROWTH 2 DAYS Performed at [facility]    Report Status PENDING  Incomplete  Culture, respiratory (NON-Expectorated)     Status: None   Collection Time: 11/21/15 12:35 PM  Result Value Ref Range Status   Specimen Description TRACHEAL ASPIRATE  Final   Special Requests NONE  Final   Gram Stain   Final    NO WBC SEEN FEW SQUAMOUS EPITHELIAL CELLS PRESENT FEW GRAM POSITIVE COCCI IN PAIRS RARE GRAM VARIABLE ROD    Culture   Final    Consistent with normal  respiratory flora. Performed at [facility]    Report Status 11/23/2015 FINAL  Final       ASSESSMENT / PLAN:  Bronchiectasis with thick-walled cavity right upper and lower lobe and peripheral consolidation in left lung - imaging is very suggestive for progressive MAC infection, cannot rule out superimposed acute bacterial infection Very reasonable to treat for CAP  Await  sputum AFB stain and culture -if stain is positive, will need ID consult to reintroduce treatment for MAC Surprisingly patient does not recall this diagnosis If AFB and sputum is negative then may need a bronchoscopy to further define.  SVT, appears to be atrial fibrillation- asymptomatic -ct  Lopressor for rate control Not a great candidate for long-term antibiotic regulation based on recurrent falls but may need short-term heparin     Dr. Brand Males, M.D., [facility].C.P Pulmonary and Critical Care Medicine Staff Physician [facility] Pager: [phone], If no answer or between  15:00h - 7:00h: call [phone]  11/23/2015 2:47 PM

## 2015-11-23 NOTE — Clinical Social Work Placement (Signed)
Patient has a bed at [facility] CSW has completed FL2 & will continue to follow and assist with discharge when ready.    Raynaldo Opitz, [facility] Clinical Social Worker cell #: [phone]     CLINICAL SOCIAL WORK PLACEMENT  NOTE  Date:  11/23/2015  Patient Details  Name: Heather [name] MRN: [id number] Date of Birth: 1930/02/06  Clinical Social Work is seeking post-discharge placement for this patient at the [place] level of care (*CSW will initial, date and re-position this form in  chart as items are completed):  Yes   Patient/family provided with [facility] Work Department's list of facilities offering this level of care within the geographic area requested by the patient (or if unable, by the patient's family).  Yes   Patient/family informed of their freedom to choose among providers that offer the needed level of care, that participate in Medicare, Medicaid or managed care program needed by the patient, have an available bed and are willing to accept the patient.  Yes   Patient/family informed of [facility]'s ownership interest in [facility] and [facility], as well as of the fact that they are under no obligation to receive care at these facilities.  PASRR submitted to EDS on       PASRR number received on       Existing PASRR number confirmed on 11/23/15     FL2 transmitted to all facilities in geographic area requested by pt/family on 11/23/15     FL2 transmitted to all facilities within larger geographic area on       Patient informed that his/her managed care company has contracts with or will negotiate with certain facilities, including the following:        Yes   Patient/family informed of bed offers received.  Patient chooses bed at [facility] and [place] recommends and patient chooses bed at      Patient to be transferred to [facility] on  .  Patient  to be transferred to facility by       Patient family notified on   of transfer.  Name of family member notified:        PHYSICIAN       Additional Comment:    _______________________________________________ Standley Brooking, LCSW 11/23/2015, 2:59 PM

## 2015-11-24 DIAGNOSIS — I471 Supraventricular tachycardia: Secondary | ICD-10-CM

## 2015-11-24 LAB — BASIC METABOLIC PANEL
ANION GAP: 5 (ref 5–15)
BUN: 11 mg/dL (ref 6–20)
CO2: 23 mmol/L (ref 22–32)
Calcium: 7.3 mg/dL — ABNORMAL LOW (ref 8.9–10.3)
Chloride: 104 mmol/L (ref 101–111)
Creatinine, Ser: 0.6 mg/dL (ref 0.44–1.00)
GFR calc Af Amer: >60 mL/min (ref >60)
Glucose, Bld: 93 mg/dL (ref 65–99)
POTASSIUM: 4.1 mmol/L (ref 3.5–5.1)
SODIUM: 132 mmol/L — AB (ref 135–145)

## 2015-11-24 MED ORDER — AZITHROMYCIN 250 MG PO TABS
500.0000 mg | ORAL_TABLET | ORAL | Status: DC
  Administered 2015-11-24 – 2015-11-26 (×3): 500 mg via ORAL
  Filled 2015-11-24 (×3): qty 2

## 2015-11-24 NOTE — Progress Notes (Signed)
PROGRESS NOTE    Heather [name]  [id number]  DOB: 03/28/1929  DOA: 11/20/2015 PCP: Unice Cobble, MD Outpatient Specialists:  Hospital course:  Heather [name] is a 82 y.o. independent female on no medication with history of bronchiectasis and remote history of MAC lung infection who apparently has not been followed closely by her primary physician for several years  was brought into the emergency department from home with cough, weakness and shortness of breath. She had been fairly independent prior to arrival and lives alone.  She was found on floor at home.  Pt diagnosed with CAP.  She subsequently develop episode of chest pain and SVT.    Assessment & Plan:   1. Severe sepsis secondary to presumed commmunity acquired pneumonia - Resolved now.  Clinically patient is much improved.   2. Community acquired pneumonia-Given abnormal CT chest there is concern about progressive MAC infection, consulted pulmonologist, AFB smears negative, culture pending;  patient empirically started on Rocephin and Zithromax IV for CAP and clinically much better. Sputum cultures: normal respiratory flora. 3. SVT / ?paroxysmal AFIB - resolved with beta blocker administration, now on coreg 3.125 BID, monitor on telemetry for recurrence. Pt poor candidate for chronic anticoagulation.  Cardiac enzymes trending.  Echo with diastolic dysfunction but good systolic function.  No severe valvular abnormalities.  4. Hyponatremia-likely secondary to dehydration-improved with hydration.  Following BMP.  5. Leukocytosis - improving, secondary to sepsis and pneumonia.  WBC trending down.  6. Hypotension - Improved now.  Resolved.  Pt had some initial soft BPs but improved with IVF hydration.  She still has fairly poor oral intake.   7. Elevated lactic acid - improved rapidly with hydration.  8. Fall with Generalized weakness-PT recommending SNF which I am in agreement with.  Pt adamant that she wants to return home to live  independently but I do not believe that it is safe for her to do so.  She has agreed to go to [facility] for rehabilitation.   9. Severe progressive Bronchiectasis with history of MAC infection-Cultures still pending at this time.  AFB smear negative.  Patient has not followed up recently with outpatient pulmonologist.  Appreciate pulmonary consult.  CT chest done reveals marked worsening of her condition.  10. Severe protein calorie malnutrition-consulted dietitian for recommendations. Pt is eating her meals now.   11. History of recurrent falls-as above PT recommending SNF. 12. Stable chronic diastolic congestive heart failure - Pt has a few crackles on exam 9/18 - gave gentle IV lasix dose.   13. Found on floor-Elevated CK levels noted, CK trending down with IVF hydration. 14. Positive blood culture: I suspect that this was a contaminant: coag neg staph species.  15. Advance Directives:  I had a long conversation with patient 9/17 and she was very clear to me that she wanted to be DNR.  She did not want artificial resuscitation, intubation, mechanical ventilation, pressor support for blood pressure if she decompensated and preferred a natural death.   Will make patient DNR per her wishes.   DVT Prophylaxis: enoxaparin Code Status: full  Disposition Plan: Will likely need SNF, anticipate discharge in 1-2 days  Consultants:  Pulmonary [facility])  Procedures:  CTA  Antimicrobials: Anti-infectives    Start     Dose/Rate Route Frequency Ordered Stop   11/21/15 1700  azithromycin (ZITHROMAX) 500 mg in dextrose 5 % 250 mL IVPB     500 mg 250 mL/hr over 60 Minutes Intravenous Every 24 hours 11/20/15  2004 11/28/15 1659   11/21/15 1600  cefTRIAXone (ROCEPHIN) 1 g in dextrose 5 % 50 mL IVPB     1 g 100 mL/hr over 30 Minutes Intravenous Every 24 hours 11/20/15 2004 11/28/15 1559   11/20/15 1715  cefTRIAXone (ROCEPHIN) 1 g in dextrose 5 % 50 mL IVPB     1 g 100 mL/hr over 30 Minutes Intravenous   Once 11/20/15 1711 11/20/15 1747   11/20/15 1715  azithromycin (ZITHROMAX) 500 mg in dextrose 5 % 250 mL IVPB     500 mg 250 mL/hr over 60 Minutes Intravenous  Once 11/20/15 1711 11/20/15 1854   11/20/15 1645  vancomycin (VANCOCIN) IVPB 1000 mg/200 mL premix  Status:  Discontinued     1,000 mg 200 mL/hr over 60 Minutes Intravenous  Once 11/20/15 1633 11/20/15 1633      Subjective: Pt reports less productive cough today.   Overall feeling better.  Pt having some pleuritic chest wall pain.  Objective: Vitals:   11/23/15 1315 11/23/15 1421 11/23/15 2231 11/24/15 0620  BP: (!) 100/54  (!) 110/50 116/73  Pulse: 94  83 88  Resp: 20  18 18   Temp: 98.5 F (36.9 C)  97.8 F (36.6 C) 99 F (37.2 C)  TempSrc: Oral  Axillary Oral  SpO2: 99% 95% 97% 94%  Weight:    52.6 kg (115 lb 15.4 oz)  Height:        Intake/Output Summary (Last 24 hours) at 11/24/15 0902 Last data filed at 11/23/15 2300  Gross per 24 hour  Intake              300 ml  Output             1200 ml  Net             -900 ml   Filed Weights   11/22/15 0528 11/23/15 0610 11/24/15 0620  Weight: 49.3 kg (108 lb 11 oz) 53.1 kg (117 lb 1 oz) 52.6 kg (115 lb 15.4 oz)   Exam:   General exam: cachectic elderly patient, appears younger than stated age, in no obvious distress.  Head, eyes and ENT: Nontraumatic and normocephalic. Pupils equally reacting to light and accommodation. Oral mucosa very dry.  Neck: Supple. No JVD, carotid bruit or thyromegaly.  Lymphatics: No lymphadenopathy.  Respiratory system: upper airway congestion, overall improved. diminished at both bases with rhonchi and scattered crackles.  No increased work of breathing.  Cardiovascular system: S1 and S2 heard, tachycardic. No JVD, murmurs, gallops, clicks or pedal edema.  Gastrointestinal system: Abdomen is nondistended, soft and nontender. Normal bowel sounds heard. No organomegaly or masses appreciated.  Central nervous system: Alert and  oriented. No focal neurological deficits.  Extremities: Symmetric 5 x 5 power. Peripheral pulses symmetrically felt.   Skin: No rashes or acute findings.  Musculoskeletal system: Negative exam.  Psychiatry: Pleasant and cooperative.  Data Reviewed: Basic Metabolic Panel:  Recent Labs Lab 11/20/15 1608 11/21/15 0241 11/22/15 0524 11/23/15 0411 11/24/15 0511  NA 131* 130* 134* 133* 132*  K 4.1 3.5 3.8 3.4* 4.1  CL 97* 100* 106 104 104  CO2 21* 22 22 22 23   GLUCOSE 114* 97 94 90 93  BUN 21* 19 15 14 11   CREATININE 0.91 0.89 0.70 0.73 0.60  CALCIUM 8.9 8.1* 7.8* 7.2* 7.3*   Liver Function Tests:  Recent Labs Lab 11/20/15 1608  AST 44*  ALT 18  ALKPHOS 96  BILITOT 1.0  PROT 8.7*  ALBUMIN 3.1*  No results for input(s): LIPASE, AMYLASE in the last 168 hours. No results for input(s): AMMONIA in the last 168 hours. CBC:  Recent Labs Lab 11/20/15 1608 11/21/15 0241 11/22/15 0520 11/23/15 0411  WBC 16.8* 16.4* 13.1* 12.7*  NEUTROABS 15.0* 13.9* 10.4* 9.6*  HGB 12.3 10.5* 9.3* 9.0*  HCT 39.0 32.6* 30.1* 27.5*  MCV 79.1 76.7* 78.6 76.2*  PLT 444* 400 455* 430*   Cardiac Enzymes:  Recent Labs Lab 11/20/15 2030 11/20/15 2106 11/20/15 2110 11/21/15 0241 11/21/15 0840 11/22/15 0524  CKTOTAL 683*  --  667* 511*  --  189  CKMB  --   --  24.8*  --   --   --   TROPONINI  --  0.03*  --  0.03* 0.03*  --    CBG (last 3)  No results for input(s): GLUCAP in the last 72 hours. Recent Results (from the past 240 hour(s))  Urine culture     Status: None   Collection Time: 11/20/15  3:41 PM  Result Value Ref Range Status   Specimen Description URINE, RANDOM  Final   Special Requests NONE  Final   Culture NO GROWTH Performed at [facility]   Final   Report Status 11/22/2015 FINAL  Final  Blood Culture (routine x 2)     Status: Abnormal   Collection Time: 11/20/15  4:13 PM  Result Value Ref Range Status   Specimen Description BLOOD RIGHT ANTECUBITAL   Final   Special Requests BOTTLES DRAWN AEROBIC AND ANAEROBIC 5 CC EA  Final   Culture  Setup Time   Final    GRAM POSITIVE COCCI IN CLUSTERS IN BOTH AEROBIC AND ANAEROBIC BOTTLES CRITICAL RESULT CALLED TO, READ BACK BY AND VERIFIED WITH: T GREEN,PHARMD AT 1520 11/21/15 BY L BENFIELD    Culture (A)  Final    STAPHYLOCOCCUS SPECIES (COAGULASE NEGATIVE) THE SIGNIFICANCE OF ISOLATING THIS ORGANISM FROM A SINGLE SET OF BLOOD CULTURES WHEN MULTIPLE SETS ARE DRAWN IS UNCERTAIN. PLEASE NOTIFY THE MICROBIOLOGY DEPARTMENT WITHIN ONE WEEK IF SPECIATION AND SENSITIVITIES ARE REQUIRED. Performed at [facility]    Report Status 11/23/2015 FINAL  Final  Blood Culture ID Panel (Reflexed)     Status: Abnormal   Collection Time: 11/20/15  4:13 PM  Result Value Ref Range Status   Enterococcus species NOT DETECTED NOT DETECTED Final   Listeria monocytogenes NOT DETECTED NOT DETECTED Final   Staphylococcus species DETECTED (A) NOT DETECTED Final    Comment: CRITICAL RESULT CALLED TO, READ BACK BY AND VERIFIED WITH: T GREEN,PHARMD AT 1520 11/21/15 BY L BENFIELD    Staphylococcus aureus NOT DETECTED NOT DETECTED Final   Methicillin resistance NOT DETECTED NOT DETECTED Final   Streptococcus species NOT DETECTED NOT DETECTED Final   Streptococcus agalactiae NOT DETECTED NOT DETECTED Final   Streptococcus pneumoniae NOT DETECTED NOT DETECTED Final   Streptococcus pyogenes NOT DETECTED NOT DETECTED Final   Acinetobacter baumannii NOT DETECTED NOT DETECTED Final   Enterobacteriaceae species NOT DETECTED NOT DETECTED Final   Enterobacter cloacae complex NOT DETECTED NOT DETECTED Final   Escherichia coli NOT DETECTED NOT DETECTED Final   Klebsiella oxytoca NOT DETECTED NOT DETECTED Final   Klebsiella pneumoniae NOT DETECTED NOT DETECTED Final   Proteus species NOT DETECTED NOT DETECTED Final   Serratia marcescens NOT DETECTED NOT DETECTED Final   Haemophilus influenzae NOT DETECTED NOT DETECTED Final    Neisseria meningitidis NOT DETECTED NOT DETECTED Final   Pseudomonas aeruginosa NOT DETECTED NOT DETECTED Final  Candida albicans NOT DETECTED NOT DETECTED Final   Candida glabrata NOT DETECTED NOT DETECTED Final   Candida krusei NOT DETECTED NOT DETECTED Final   Candida parapsilosis NOT DETECTED NOT DETECTED Final   Candida tropicalis NOT DETECTED NOT DETECTED Final    Comment: Performed at [facility]  Blood Culture (routine x 2)     Status: None (Preliminary result)   Collection Time: 11/20/15  8:20 PM  Result Value Ref Range Status   Specimen Description BLOOD RIGHT ARM  Final   Special Requests BOTTLES DRAWN AEROBIC AND ANAEROBIC [place]  Final   Culture   Final    NO GROWTH 3 DAYS Performed at [facility]    Report Status PENDING  Incomplete  Acid Fast Smear (AFB)     Status: None   Collection Time: 11/21/15 12:35 PM  Result Value Ref Range Status   AFB Specimen Processing Concentration  Final   Acid Fast Smear Negative  Final    Comment: (NOTE) Performed At: [facility] [address] [place], [place] [id number] Lindon Romp MD [id number]    Source (AFB) SPUTUM  Final  Culture, respiratory (NON-Expectorated)     Status: None   Collection Time: 11/21/15 12:35 PM  Result Value Ref Range Status   Specimen Description TRACHEAL ASPIRATE  Final   Special Requests NONE  Final   Gram Stain   Final    NO WBC SEEN FEW SQUAMOUS EPITHELIAL CELLS PRESENT FEW GRAM POSITIVE COCCI IN PAIRS RARE GRAM VARIABLE ROD    Culture   Final    Consistent with normal respiratory flora. Performed at [facility]    Report Status 11/23/2015 FINAL  Final    Studies: No results found. Scheduled Meds: . aspirin EC  325 mg Oral Daily  . atorvastatin  80 mg Oral q1800  . azithromycin  500 mg Intravenous Q24H  . carvedilol  3.125 mg Oral BID WC  . cefTRIAXone (ROCEPHIN)  IV  1 g Intravenous Q24H  . enoxaparin (LOVENOX) injection  40 mg Subcutaneous Q24H    . feeding supplement (ENSURE ENLIVE)  237 mL Oral BID BM  . guaiFENesin  600 mg Oral BID  . potassium chloride  20 mEq Oral TID  . ramipril  1.25 mg Oral BID  . sodium chloride flush  3 mL Intravenous Q12H   Continuous Infusions:   Principal Problem:   Sepsis ([place]) Active Problems:   CAP (community acquired pneumonia)   Mild CHF exacerbation   Elevated lactic acid level   Severe dehydration   Paroxysmal SVT (supraventricular tachycardia) (HCC)   MAC (mycobacterium avium-intracellulare complex)   RAYNAUD'S DISEASE   VOCAL CORD DISORDER   FTT (failure to thrive) in adult   Protein-calorie malnutrition, severe (HCC)   Recurrent falls   Non-compliant behavior   Leukocytosis   Generalized weakness   Productive cough   Bronchiectasis (HCC)   Hyponatremia   Chest pain  Time spent:   Irwin Brakeman, MD, FAAFP [facility] Pager [phone]  If 7PM-7AM, please contact night-coverage [email or web address] Password [email or web address] 11/24/2015, 9:02 AM    LOS: 4 days

## 2015-11-24 NOTE — Progress Notes (Signed)
Physical Therapy Treatment Patient Details Name: Heather [name] MRN: [id number] DOB: 1929-03-17 Today's Date: 11/24/2015    History of Present Illness 82 y/o female admitted through ED with cough, weakness and SOB    PT Comments    Pt OOB in recliner.  Assisted with amb to bathroom using RW pt rerquired much instruction on proper use.  Assisted on/off toilet.  Unsteady.  VC's for safety.  Amb in hallway without RW noted increased gait instability and poor self correction to midline with forward flex posture and need to occassionally hole on to something.   Follow Up Recommendations   SNF [place] stated pt     Equipment Recommendations       Recommendations for Other Services       Precautions / Restrictions Precautions Precautions: Fall Restrictions Weight Bearing Restrictions: No    Mobility  Bed Mobility               General bed mobility comments: OOB in recliner  Transfers Overall transfer level: Needs assistance Equipment used: Rolling walker (2 wheeled);None Transfers: Sit to/from [facility]   Stand pivot transfers: Min assist       General transfer comment: Assist to rise, stabilize, control descent.   VC's for safety with turns and assisted in bathroom.    Ambulation/Gait Ambulation/Gait assistance: Min assist;Mod assist Ambulation Distance (Feet): 85 Feet Assistive device: Rolling walker (2 wheeled);None Gait Pattern/deviations: Step-to pattern;Step-through pattern Gait velocity: WFL   General Gait Details: Assist to stabilize intermittently. Pt tolerated distance well but normally does not amb with walker.  assisted with amb hand held assist pt demonstarted increased gait instability.  HIGH FALL RISK.   Stairs            Wheelchair Mobility    Modified Rankin (Stroke Patients Only)       Balance                                    Cognition Arousal/Alertness: Awake/alert Behavior During Therapy:  WFL for tasks assessed/performed Overall Cognitive Status: Within Functional Limits for tasks assessed                      Exercises      General Comments        Pertinent Vitals/Pain Pain Assessment: No/denies pain     End of Session  in recliner with chair alarm active and call light in reach         Time: 1139-1153 PT Time Calculation (min) (ACUTE ONLY): 14 min  Charges:  $Gait Training: 8-22 mins                    G Codes:      Rica Koyanagi  PTA [facility]  Acute  Rehab Pager      [phone]

## 2015-11-24 NOTE — Care Management Important Message (Signed)
Important Message  Patient Details  Name: Heather [name] MRN: [id number] Date of Birth: 12-Jun-1929   Medicare Important Message Given:  Yes    Camillo Flaming 11/24/2015, 10:11 AMImportant Message  Patient Details  Name: Heather [name] MRN: [id number] Date of Birth: Dec 26, 1929   Medicare Important Message Given:  Yes    Camillo Flaming 11/24/2015, 10:11 AM

## 2015-11-24 NOTE — Progress Notes (Signed)
PHARMACIST - PHYSICIAN COMMUNICATION CONCERNING: Antibiotic IV to Oral Route Change Policy  RECOMMENDATION: This patient is receiving Azithromycin by the intravenous route.  Based on criteria approved by the Pharmacy and Therapeutics Committee, the antibiotic(s) is/are being converted to the equivalent oral dose form(s).   DESCRIPTION: These criteria include:  Patient being treated for a respiratory tract infection, urinary tract infection, cellulitis or clostridium difficile associated diarrhea if on metronidazole  The patient is not neutropenic and does not exhibit a GI malabsorption state  The patient is eating (either orally or via tube) and/or has been taking other orally administered medications for a least 24 hours  The patient is improving clinically and has a Tmax < 100.5  If you have questions about this conversion, please contact the Pharmacy Department  []   [phone] )  [facility]aniy[facility] Totman []   [phone] )  [facility] []   [phone] )  [facility] Pontes []   [phone] )  [facility] [x]   [phone] )  [place], PharmD, [place] Pager: [phone] 11/24/2015 10:37 AM

## 2015-11-25 ENCOUNTER — Telehealth: Payer: Self-pay | Admitting: Internal Medicine

## 2015-11-25 LAB — CBC
HEMATOCRIT: 31.2 % — AB (ref 36.0–46.0)
HEMOGLOBIN: 9.6 g/dL — AB (ref 12.0–15.0)
MCH: 24.4 pg — AB (ref 26.0–34.0)
MCHC: 30.8 g/dL (ref 30.0–36.0)
MCV: 79.2 fL (ref 78.0–100.0)
Platelets: 473 10*3/uL — ABNORMAL HIGH (ref 150–400)
RBC: 3.94 MIL/uL (ref 3.87–5.11)
RDW: 16.8 % — ABNORMAL HIGH (ref 11.5–15.5)
WBC: 10.2 10*3/uL (ref 4.0–10.5)

## 2015-11-25 LAB — CULTURE, BLOOD (ROUTINE X 2): CULTURE: NO GROWTH

## 2015-11-25 MED ORDER — DOXYCYCLINE HYCLATE 100 MG PO CAPS: 100.0000 mg | ORAL_CAPSULE | Freq: Two times a day (BID) | ORAL | 0 refills | Status: DC

## 2015-11-25 MED ORDER — AZITHROMYCIN 250 MG PO TABS: 250.0000 mg | ORAL_TABLET | Freq: Every day | ORAL | 0 refills | Status: DC

## 2015-11-25 MED ORDER — RAMIPRIL 1.25 MG PO CAPS: 1.2500 mg | ORAL_CAPSULE | Freq: Two times a day (BID) | ORAL | 0 refills | Status: AC

## 2015-11-25 MED ORDER — CARVEDILOL 3.125 MG PO TABS: 3.1250 mg | ORAL_TABLET | Freq: Two times a day (BID) | ORAL | 0 refills | Status: AC

## 2015-11-25 MED ORDER — ASPIRIN 325 MG PO TBEC: 325.0000 mg | DELAYED_RELEASE_TABLET | Freq: Every day | ORAL | 0 refills | Status: AC

## 2015-11-25 MED ORDER — GUAIFENESIN 100 MG/5ML PO SOLN: 5.0000 mL | ORAL | 0 refills | Status: AC | PRN

## 2015-11-25 MED ORDER — ATORVASTATIN CALCIUM 80 MG PO TABS: 80.0000 mg | ORAL_TABLET | Freq: Every day | ORAL | Status: DC

## 2015-11-25 NOTE — Clinical Social Work Note (Signed)
Both [employer] and [facility] now able to verify benefits and do NOT have contracts with [employer]. [employer] will NOT approve out-of-network contract.   MSW contacted [facility] who has contract with [place] and requested that facility initiate authorization- patient is aware and agreeable to this placement. Patient is also aware that she will need to pay privately at a rate of $289 per night until authorization is obtained. MSW updated patient's caregiver, Joelene Millin who will transport patient to facility. Joelene Millin will arrive at [facility] around [place]. MSW provided Joelene Millin with address to the facility.   MSW apologized in regards to confusion with verification of insurance provider. No further concerns reported at this time by patient or caregiver.   RN informed of the above and provided with packet which ONLY includes DNR and number for report (patient does not require full dc packet due to caregiver transporting patient).  MSW also contacted patient's husband, Barnabas Lister who lives in [place] and was also confused in regards to which insurance patient has. Patient's husband aware that patient will d/c to SNF today, 9/20.   Medical Social Worker facilitated patient discharge including contacting patient family and facility to confirm patient discharge plans.  Clinical information faxed to facility and family agreeable with plan.  Patient's caregiver, Joelene Millin to transport to [facility] .  RN to call report prior to discharge [phone].  Margit Banda [id number] [phone]  Medical Social Worker will sign off for now as social work intervention is no longer needed. Please consult [place] again if new need arises.  Glendon Axe, MSW [phone] 11/25/2015 4:39 PM

## 2015-11-25 NOTE — Discharge Summary (Addendum)
Physician Discharge Summary  Heather [name] [id number] DOB: [birth year] DOA: 11/20/2015  PCP: Unice Cobble, MD  Admit date: 11/20/2015 Discharge date: 11/26/2015  Recommendations for Outpatient Follow-up:  1. Pt will need to follow up with PCP in 1-1 weeks post discharge 2. Please obtain BMP to evaluate electrolytes and kidney function 3. Please also check CBC to evaluate Hg and Hct levels 4. Pt to complete zithromax for 4 more days post discharge  5. Pt also advised to follow up with Dr. Elsworth Soho pulmonologist in next two weeks, information provided below   Discharge Diagnoses:  Principal Problem:   Sepsis ([place]) Active Problems:   MAC (mycobacterium avium-intracellulare complex)   RAYNAUD'S DISEASE  Discharge Condition: Stable  Diet recommendation: Heart healthy diet discussed in details   History of present illness:  82 y.o.independent femaleon no medication with history of bronchiectasis and remote history of MAC lung infection who apparently has not been followed closely by her primary physician for several years was brought into the emergency department from home with cough, weakness and shortness of breath. She had been fairly independent prior to arrival and lives alone.  She was found on floor at home.  Pt diagnosed with CAP.  She subsequently develop episode of chest pain and SVT.    Assessment & Plan:   1. Severe sepsis secondary to presumed multifocal commmunity acquired pneumonia - Resolved now. Clinically patient is much improved. WBC is now WNL. 2. Community acquired pneumonia, multifocal - Given abnormal CT chest there is concern about progressive MAC infection, consulted pulmonologist, AFB smears negative, culture pending;  patient empirically started on Rocephin and Zithromax IV for CAP and clinically much better. Sputum cultures: normal respiratory flora. Pt wants to be discharged, Ok with discharge today and will provide script for Zithromax for pt to complete  therapy for 4 more days post discharge. Pt advised to have follow up with Dr. Elsworth Soho in next 1- 2 weeks.  3. SVT / ?paroxysmal AFIB - resolved with beta blocker administration, now on coreg 3.125 BID. Pt poor candidate for chronic anticoagulation. Echo with diastolic dysfunction but good systolic function.  No severe valvular abnormalities.  4. Hyponatremia-likely secondary to dehydration-improved with hydration.  5. Hypotension - Improved now.  Resolved.   6. Fall with Generalized weakness-PT recommending SNF. She has agreed to go to [facility] for rehabilitation.   7. Severe progressive Bronchiectasis with history of MAC infection-Cultures still pending at this time.  AFB smear negative.  Patient has not followed up recently with outpatient pulmonologist.  Appreciate pulmonary consult.  CT chest done reveals marked worsening of her condition.  8. Severe protein calorie malnutrition-consulted dietitian for recommendations. Pt tolerating diet well. 9. History of recurrent falls-as above PT recommending SNF. 10. Stable chronic diastolic congestive heart failure - stable this time  11. Found on floor-Elevated CK levels noted, CK trending down with IVF hydration. 12. Positive blood culture: suspect that this was a contaminant: coag neg staph species.   DVT Prophylaxis:enoxaparin Code Status:DNR Disposition Plan:SNF Update: called several numbers, the only answer I got from her husband, Barnabas Lister who lives in [place] and he says that his wife does not want to talk to him, they are still married and he thinks he is still her POA   Consultants:  Pulmonary Elsworth Soho)  Procedures:  CTA  Procedures/Studies: Dg Chest 2 View  Result Date: 11/20/2015 CLINICAL DATA:  Cough EXAM: CHEST  2 VIEW COMPARISON:  12/23/2012 FINDINGS: The heart size is normal. Aortic atherosclerosis and  tortuosity is identified. There is mild interstitial edema in a small left pleural effusion. Airspace opacities are identified  within the right midlung and right upper lobe and left lower lobe. Chronic interstitial changes of COPD/emphysema noted. IMPRESSION: 1. Suspect mild CHF. 2. Bilateral multifocal airspace opacities are identified compatible with pneumonia. Followup PA and lateral chest X-ray is recommended in 3-4 weeks following trial of antibiotic therapy to ensure resolution and exclude underlying malignancy. Electronically Signed   By: Kerby Moors M.D.   On: 11/20/2015 16:58   Ct Angio Chest Pe W Or Wo Contrast  Result Date: 11/20/2015 CLINICAL DATA:  Elevated D-dimer. Tachycardia. Chest pain. Shortness of breath. EXAM: CT ANGIOGRAPHY CHEST WITH CONTRAST TECHNIQUE: Multidetector CT imaging of the chest was performed using the standard protocol during bolus administration of intravenous contrast. Multiplanar CT image reconstructions and MIPs were obtained to evaluate the vascular anatomy. CONTRAST:  100 cc Isovue 370 IV COMPARISON:  Chest radiograph earlier this day. High-resolution chest CT 06/02/2010, no interval imaging FINDINGS: Cardiovascular: There are no filling defects within the pulmonary arteries to suggest pulmonary embolus. The thoracic aorta is normal in caliber with mild atherosclerosis. A few coronary artery calcifications are seen. Mitral valvular calcifications are seen. Mediastinum/Nodes: Enlarged subcarinal lymph node measures 1.7 cm. Soft tissue density at the right hila without well-defined noted. An upper paratracheal node measures 10 mm. There is scattered esophageal wall thickening. No pericardial effusion. Lungs/Pleura: Cylindrical bronchiectasis within all lobes of both lungs has progressed from the prior exam. There is complete volume loss in the right middle lobe with whole lobe atelectasis, minimal aerated lung paramediastinal. Development of peripheral cavitary lesions in the right upper and lower lobes. In the right upper lobe cavitary process is complex, measuring at least 6.9 x 2.8 cm to  encompass multiple adjacent cavitary densities. These lesions have a thick wall. More superiorly is subpleural cavitary formation with thick wall in the medial apex. In the lower lobe, cavitary process measures at least 3.3 x 2.5 cm with probable small fluid level. There fluid soft tissue density within the right lower lobe cavitary lesions. Bronchiectasis in the left lung is less severe, most prominent in the lingula and central lower lobe. There are peripheral consolidations in the left lower lobe and lingula appear confluent. Ground-glass and nodular opacities with tree in bud opacities are seen throughout the lungs. There is a small left pleural effusion. Multifocal right pleural thickening without discrete effusion. No definite septal thickening suggest pulmonary edema. Upper Abdomen: No acute abnormality. Musculoskeletal: Exaggerated thoracic kyphosis. There are no acute or suspicious osseous abnormalities. Review of the MIP images confirms the above findings. IMPRESSION: 1. No pulmonary embolus. 2. Marked progression since 2012 of bronchiectasis primarily in the right lung, with near complete atelectasis of the right middle lobe. Development of thick-walled cavitary lesions in the right upper and lower lobe which are contiguous with bronchiectasis. There is fluid and soft tissue density within the lower lobe cavitary lesions. Findings may be due to progressive infection versus malignancy. 3. Peripheral consolidations in the lingula and left lower lobe may be part of the background process versus acute pneumonia. These results will be called to the ordering clinician or representative by the Radiologist Assistant, and communication documented in the PACS or zVision Dashboard. Electronically Signed   By: Jeb Levering M.D.   On: 11/20/2015 23:48     Discharge Exam: Vitals:   11/24/15 2138 11/25/15 0541  BP: (!) 103/44 (!) 122/57  Pulse: 85 74  Resp:  18  Temp: 99.3 F (37.4 C) 97.3 F (36.3 C)    Vitals:   11/24/15 0620 11/24/15 1311 11/24/15 2138 11/25/15 0541  BP: 116/73 (!) 102/39 (!) 103/44 (!) 122/57  Pulse: 88 84 85 74  Resp: 18 16  18   Temp: 99 F (37.2 C) 98.9 F (37.2 C) 99.3 F (37.4 C) 97.3 F (36.3 C)  TempSrc: Oral Oral Oral Oral  SpO2: 94% 98% 95% 96%  Weight: 52.6 kg (115 lb 15.4 oz)   52.3 kg (115 lb 4.8 oz)  Height:        General: Pt is alert, follows commands appropriately, not in acute distress Cardiovascular: Regular rate and rhythm, S1/S2 +,  no rubs, no gallops Respiratory: no crackles, no rhonchi Abdominal: Soft, non tender, non distended, bowel sounds +, no guarding  Discharge Instructions  Discharge Instructions    Diet - low sodium heart healthy    Complete by:  As directed    Increase activity slowly    Complete by:  As directed        Medication List    TAKE these medications   aspirin 325 MG EC tablet Take 1 tablet (325 mg total) by mouth daily. Start taking on:  11/26/2015   atorvastatin 80 MG tablet Commonly known as:  LIPITOR Take 1 tablet (80 mg total) by mouth daily at 6 PM.   azithromycin 250 MG tablet Commonly known as:  ZITHROMAX Take 1 tablet (250 mg total) by mouth daily.   carvedilol 3.125 MG tablet Commonly known as:  COREG Take 1 tablet (3.125 mg total) by mouth 2 (two) times daily with a meal.   guaiFENesin 100 MG/5ML Soln Commonly known as:  ROBITUSSIN Take 5 mLs (100 mg total) by mouth every 4 (four) hours as needed for cough or to loosen phlegm.   ramipril 1.25 MG capsule Commonly known as:  ALTACE Take 1 capsule (1.25 mg total) by mouth 2 (two) times daily.       Follow-up Information    Unice Cobble, MD .   Specialty:  Internal Medicine Contact information: [place] [address] [phone]        Rigoberto Noel., MD. Schedule an appointment as soon as possible for a visit today.   Specialty:  Pulmonary Disease Contact information: [address]  [address] [phone]            The results of significant diagnostics from this hospitalization (including imaging, microbiology, ancillary and laboratory) are listed below for reference.     Microbiology: Recent Results (from the past 240 hour(s))  Urine culture     Status: None   Collection Time: 11/20/15  3:41 PM  Result Value Ref Range Status   Specimen Description URINE, RANDOM  Final   Special Requests NONE  Final   Culture NO GROWTH Performed at [facility]   Final   Report Status 11/22/2015 FINAL  Final  Blood Culture (routine x 2)     Status: Abnormal   Collection Time: 11/20/15  4:13 PM  Result Value Ref Range Status   Specimen Description BLOOD RIGHT ANTECUBITAL  Final   Special Requests BOTTLES DRAWN AEROBIC AND ANAEROBIC 5 CC EA  Final   Culture  Setup Time   Final    GRAM POSITIVE COCCI IN CLUSTERS IN BOTH AEROBIC AND ANAEROBIC BOTTLES CRITICAL RESULT CALLED TO, READ BACK BY AND VERIFIED WITH: T GREEN,PHARMD AT 1520 11/21/15 BY L BENFIELD    Culture (  A)  Final    STAPHYLOCOCCUS SPECIES (COAGULASE NEGATIVE) THE SIGNIFICANCE OF ISOLATING THIS ORGANISM FROM A SINGLE SET OF BLOOD CULTURES WHEN MULTIPLE SETS ARE DRAWN IS UNCERTAIN. PLEASE NOTIFY THE MICROBIOLOGY DEPARTMENT WITHIN ONE WEEK IF SPECIATION AND SENSITIVITIES ARE REQUIRED. Performed at [facility]    Report Status 11/23/2015 FINAL  Final  Blood Culture ID Panel (Reflexed)     Status: Abnormal   Collection Time: 11/20/15  4:13 PM  Result Value Ref Range Status   Enterococcus species NOT DETECTED NOT DETECTED Final   Listeria monocytogenes NOT DETECTED NOT DETECTED Final   Staphylococcus species DETECTED (A) NOT DETECTED Final    Comment: CRITICAL RESULT CALLED TO, READ BACK BY AND VERIFIED WITH: T GREEN,PHARMD AT 1520 11/21/15 BY L BENFIELD    Staphylococcus aureus NOT DETECTED NOT DETECTED Final   Methicillin resistance NOT DETECTED NOT DETECTED Final   Streptococcus species NOT  DETECTED NOT DETECTED Final   Streptococcus agalactiae NOT DETECTED NOT DETECTED Final   Streptococcus pneumoniae NOT DETECTED NOT DETECTED Final   Streptococcus pyogenes NOT DETECTED NOT DETECTED Final   Acinetobacter baumannii NOT DETECTED NOT DETECTED Final   Enterobacteriaceae species NOT DETECTED NOT DETECTED Final   Enterobacter cloacae complex NOT DETECTED NOT DETECTED Final   Escherichia coli NOT DETECTED NOT DETECTED Final   Klebsiella oxytoca NOT DETECTED NOT DETECTED Final   Klebsiella pneumoniae NOT DETECTED NOT DETECTED Final   Proteus species NOT DETECTED NOT DETECTED Final   Serratia marcescens NOT DETECTED NOT DETECTED Final   Haemophilus influenzae NOT DETECTED NOT DETECTED Final   Neisseria meningitidis NOT DETECTED NOT DETECTED Final   Pseudomonas aeruginosa NOT DETECTED NOT DETECTED Final   Candida albicans NOT DETECTED NOT DETECTED Final   Candida glabrata NOT DETECTED NOT DETECTED Final   Candida krusei NOT DETECTED NOT DETECTED Final   Candida parapsilosis NOT DETECTED NOT DETECTED Final   Candida tropicalis NOT DETECTED NOT DETECTED Final    Comment: Performed at [facility]  Blood Culture (routine x 2)     Status: None   Collection Time: 11/20/15  8:20 PM  Result Value Ref Range Status   Specimen Description BLOOD RIGHT ARM  Final   Special Requests BOTTLES DRAWN AEROBIC AND ANAEROBIC [place]  Final   Culture   Final    NO GROWTH 5 DAYS Performed at [facility]    Report Status 11/25/2015 FINAL  Final  Acid Fast Smear (AFB)     Status: None   Collection Time: 11/21/15 12:35 PM  Result Value Ref Range Status   AFB Specimen Processing Concentration  Final   Acid Fast Smear Negative  Final    Comment: (NOTE) Performed At: [facility] [address] [place], [place] [id number] Lindon Romp MD [id number]    Source (AFB) SPUTUM  Final  Culture, respiratory (NON-Expectorated)     Status: None   Collection Time: 11/21/15 12:35 PM   Result Value Ref Range Status   Specimen Description TRACHEAL ASPIRATE  Final   Special Requests NONE  Final   Gram Stain   Final    NO WBC SEEN FEW SQUAMOUS EPITHELIAL CELLS PRESENT FEW GRAM POSITIVE COCCI IN PAIRS RARE GRAM VARIABLE ROD    Culture   Final    Consistent with normal respiratory flora. Performed at [facility]    Report Status 11/23/2015 FINAL  Final     Labs: Basic Metabolic Panel:  Recent Labs Lab 11/20/15 1608 11/21/15 0241 11/22/15 [id number]  11/23/15 0411 11/24/15 0511  NA 131* 130* 134* 133* 132*  K 4.1 3.5 3.8 3.4* 4.1  CL 97* 100* 106 104 104  CO2 21* 22 22 22 23   GLUCOSE 114* 97 94 90 93  BUN 21* 19 15 14 11   CREATININE 0.91 0.89 0.70 0.73 0.60  CALCIUM 8.9 8.1* 7.8* 7.2* 7.3*   Liver Function Tests:  Recent Labs Lab 11/20/15 1608  AST 44*  ALT 18  ALKPHOS 96  BILITOT 1.0  PROT 8.7*  ALBUMIN 3.1*   No results for input(s): LIPASE, AMYLASE in the last 168 hours. No results for input(s): AMMONIA in the last 168 hours. CBC:  Recent Labs Lab 11/20/15 1608 11/21/15 0241 11/22/15 0520 11/23/15 0411 11/25/15 0507  WBC 16.8* 16.4* 13.1* 12.7* 10.2  NEUTROABS 15.0* 13.9* 10.4* 9.6*  --   HGB 12.3 10.5* 9.3* 9.0* 9.6*  HCT 39.0 32.6* 30.1* 27.5* 31.2*  MCV 79.1 76.7* 78.6 76.2* 79.2  PLT 444* 400 455* 430* 473*   Cardiac Enzymes:  Recent Labs Lab 11/20/15 2030 11/20/15 2106 11/20/15 2110 11/21/15 0241 11/21/15 0840 11/22/15 0524  CKTOTAL 683*  --  667* 511*  --  189  CKMB  --   --  24.8*  --   --   --   TROPONINI  --  0.03*  --  0.03* 0.03*  --    BNP: BNP (last 3 results)  Recent Labs  11/20/15 1608  BNP 272.0*    SIGNED: Time coordinating discharge:  30 minutes  Faye Ramsay, MD  [facility] 11/25/2015, 12:27 PM Pager [phone]  If 7PM-7AM, please contact night-coverage [email or web address] Password [email or web address]

## 2015-11-25 NOTE — Progress Notes (Signed)
CSW assisting with d/c planning. Pt is ready for d/c to SNF but SNF is unable to verify pt's insurance. CSW contacted [facility] Admissions for assistance with insurance verification. Admissions reports that pt does not have traditional medicare as listed on demo sheet and is unable to verify the type of medicare insurance pt has. Admissions instructed CSW to call the [facility] call center for assistance as admissions is unable to further assist CSW. A message has been left at the [facility] call center for assistance with insurance verification. Awaiting return call.   Pt does not have her insurance cards with her. SNF will not accept pt until insurance can be verified.  Werner Lean LCSW [phone]

## 2015-11-25 NOTE — Telephone Encounter (Signed)
Called spoke with pt. Scheduled her with [facility] on 12/30/15. She voiced understanding and had no further questions. Nothing further needed.

## 2015-11-25 NOTE — Care Management Note (Signed)
Case Management Note  Patient Details  Name: Heather [name] MRN: [id number] Date of Birth: [birth year]  Subjective/Objective:                    Action/Plan:d/c SNF.   Expected Discharge Date:   (unknown)               Expected Discharge Plan:  [place]  In-House Referral:     Discharge planning Services  CM Consult  Post Acute Care Choice:    Choice offered to:     DME Arranged:    DME Agency:     HH Arranged:    [place] Agency:     Status of Service:  Completed, signed off  If discussed at [employer] of Stay Meetings, dates discussed:    Additional Comments:  Dessa Phi, RN 11/25/2015, 11:19 AM

## 2015-11-25 NOTE — Clinical Social Work Note (Addendum)
MSW just received call from [facility] indicating that they will NOT accept patient today due to being unable to verify benefits with [employer] before patient arrives.   SNF bed unavailable at this time. MSW to notify MD of the above.   MSW notified bedside RN, patient and caregiver, Joelene Millin. Caregiver prepared to transport patient tomorrow if needed.   Case discussed with CSW AD.   Glendon Axe, MSW [phone] 11/25/2015 4:58 PM

## 2015-11-25 NOTE — Clinical Social Work Placement (Deleted)
   CLINICAL SOCIAL WORK PLACEMENT  NOTE  Date:  11/25/2015  Patient Details  Name: MILISA LUU MRN: [id number] Date of Birth: Nov 25, 1929  Clinical Social Work is seeking post-discharge placement for this patient at the [place] level of care (*CSW will initial, date and re-position this form in  chart as items are completed):  Yes   Patient/family provided with [place] Work Department's list of facilities offering this level of care within the geographic area requested by the patient (or if unable, by the patient's family).  Yes   Patient/family informed of their freedom to choose among providers that offer the needed level of care, that participate in Medicare, Medicaid or managed care program needed by the patient, have an available bed and are willing to accept the patient.  Yes   Patient/family informed of [facility]'s ownership interest in [facility] and [facility], as well as of the fact that they are under no obligation to receive care at these facilities.  PASRR submitted to EDS on       PASRR number received on       Existing PASRR number confirmed on 11/23/15     FL2 transmitted to all facilities in geographic area requested by pt/family on 11/23/15     FL2 transmitted to all facilities within larger geographic area on       Patient informed that his/her managed care company has contracts with or will negotiate with certain facilities, including the following:        Yes   Patient/family informed of bed offers received.  Patient chooses bed at  [facility] )     Physician recommends and patient chooses bed at      Patient to be transferred to [facility] on 11/25/15.  Patient to be transferred to facility by  (caregiver, Joelene Millin )     Patient family notified on 11/25/15 of transfer.  Name of family member notified:   (caregiver, Joelene Millin and husband, Barnabas Lister )     PHYSICIAN Please  prepare priority discharge summary, including medications, Please sign FL2     Additional Comment:    _______________________________________________ Glendon Axe A 11/25/2015, 4:44 PM

## 2015-11-25 NOTE — Discharge Instructions (Signed)

## 2015-11-25 NOTE — Clinical Social Work Placement (Signed)
   CLINICAL SOCIAL WORK PLACEMENT  NOTE  Date:  11/25/2015  Patient Details  Name: Heather [name] MRN: [id number] Date of Birth: December 07, 1929  Clinical Social Work is seeking post-discharge placement for this patient at the [place] level of care (*CSW will initial, date and re-position this form in  chart as items are completed):  Yes   Patient/family provided with [place] Work Department's list of facilities offering this level of care within the geographic area requested by the patient (or if unable, by the patient's family).  Yes   Patient/family informed of their freedom to choose among providers that offer the needed level of care, that participate in Medicare, Medicaid or managed care program needed by the patient, have an available bed and are willing to accept the patient.  Yes   Patient/family informed of [facility]'s ownership interest in [facility] and [facility], as well as of the fact that they are under no obligation to receive care at these facilities.  PASRR submitted to EDS on       PASRR number received on       Existing PASRR number confirmed on 11/23/15     FL2 transmitted to all facilities in geographic area requested by pt/family on 11/23/15     FL2 transmitted to all facilities within larger geographic area on       Patient informed that his/her managed care company has contracts with or will negotiate with certain facilities, including the following:        Yes   Patient/family informed of bed offers received.  Patient chooses bed at  [facility] )     Physician recommends and patient chooses bed at      Patient to be transferred to  [facility] ) on 11/25/15.  Patient to be transferred to facility by  (caregiver, Joelene Millin )     Patient family notified on 11/25/15 of transfer.  Name of family member notified:   (caregiver, Joelene Millin and husband, Barnabas Lister )     PHYSICIAN Please  prepare priority discharge summary, including medications, Please sign FL2     Additional Comment:    _______________________________________________ Glendon Axe A 11/25/2015, 4:46 PM

## 2015-11-25 NOTE — Clinical Social Work Note (Signed)
MSW placed call to [facility] to cancel authorization. [facility] reported that facility has not actually spoken to a [employer] however faxed in clinicals. [facility] currently does not have a Designer, industrial/product and was checking for out-of-network benefits.   MSW contacted admissions director of [facility] (facility has contract with Christella Scheuermann) to start authorization. MSW also arranged for patient's caregiver, Joelene Millin to complete ppwk at facility at 1:30PM. Discharge summary and SNF transfer report sent via [place].   MSW later received call from admissions director of [facility] stating that patient ONLY has a pharmacy benefit with [employer]. Facility has confirmed that patient does NOT have traditional Medicare coverage.   CSW colleague also assisting with case in regards to confirming insurance carrier.   MSW to meet with patient at bedside regards to barrier. To discuss case with CSW AD. MSW remains available as needed.   Glendon Axe, MSW [phone] 11/25/2015 1:37 PM

## 2015-11-25 NOTE — Telephone Encounter (Signed)
D.w hospitalist at [facility] being dc;ed today t.d . No need for pccm rounding team to see 11/25/2015. Needs opd fu. Therefore, triage - pls give appt to see Dr Elsworth Soho in 2-5 weeks  Thanks  Dr. Brand Males, M.D., [facility].C.P Pulmonary and Critical Care Medicine Staff Physician [facility] Pager: [phone], If no answer or between  15:00h - 7:00h: call [phone]  11/25/2015 10:05 AM

## 2015-11-26 LAB — CBC
HCT: 31.5 % — ABNORMAL LOW (ref 36.0–46.0)
Hemoglobin: 9.7 g/dL — ABNORMAL LOW (ref 12.0–15.0)
MCH: 24.3 pg — ABNORMAL LOW (ref 26.0–34.0)
MCHC: 30.8 g/dL (ref 30.0–36.0)
MCV: 78.8 fL (ref 78.0–100.0)
PLATELETS: 495 10*3/uL — AB (ref 150–400)
RBC: 4 MIL/uL (ref 3.87–5.11)
RDW: 17.1 % — AB (ref 11.5–15.5)
WBC: 9.5 10*3/uL (ref 4.0–10.5)

## 2015-11-26 LAB — BASIC METABOLIC PANEL
ANION GAP: 6 (ref 5–15)
BUN: 14 mg/dL (ref 6–20)
CALCIUM: 8.4 mg/dL — AB (ref 8.9–10.3)
CO2: 25 mmol/L (ref 22–32)
Chloride: 104 mmol/L (ref 101–111)
Creatinine, Ser: 0.69 mg/dL (ref 0.44–1.00)
Glucose, Bld: 89 mg/dL (ref 65–99)
Potassium: 4.4 mmol/L (ref 3.5–5.1)
Sodium: 135 mmol/L (ref 135–145)

## 2015-11-26 NOTE — Progress Notes (Signed)
Plan for discharge today, pending insurance approval.   Faye Ramsay, MD  [facility] Pager [phone]  If 7PM-7AM, please contact night-coverage [email or web address] Password [email or web address]

## 2015-11-26 NOTE — Progress Notes (Signed)
Nutrition Follow-up  DOCUMENTATION CODES:   Severe malnutrition in context of chronic illness, Underweight  INTERVENTION:   Continue Ensure Enlive po BID, each supplement provides 350 kcal and 20 grams of protein Encourage PO intake  NUTRITION DIAGNOSIS:   Malnutrition related to chronic illness as evidenced by severe depletion of body fat, severe depletion of muscle mass.  Ongoing.  GOAL:   Patient will meet greater than or equal to 90% of their needs  Progressing.  MONITOR:   PO intake, Supplement acceptance, Labs, Weight trends, I & O's  ASSESSMENT:   82 year old with prior history of bronchiectasis and remote history of MAC infection, living independently at home admitted 9/15 after being found down on the floor. She reported generalized weakness for 3 days with a productive cough and mild shortness of breath.  Patient currently eating 50-100% of meals in the last 48 hours. Pt drinking at least 1 ensure supplement a day. Pt's weight is +7 lb since 9/17.  Discharge summary placed by MD, awaiting placement to SNF pending insurance authorization.  Will continue current interventions.  Labs reviewed. Medications reviewed.  Diet Order:  Diet heart healthy/carb modified Room service appropriate? Yes; Fluid consistency: Thin Diet - low sodium heart healthy Diet - low sodium heart healthy  Skin:  Reviewed, no issues  Last BM:  9/21  Height:   Ht Readings from Last 1 Encounters:  11/20/15 5\' 5"  (1.651 m)    Weight:   Wt Readings from Last 1 Encounters:  11/26/15 115 lb 15.4 oz (52.6 kg)    Ideal Body Weight:  56.8 kg  BMI:  Body mass index is 19.3 kg/m.  Estimated Nutritional Needs:   Kcal:  1300-1500  Protein:  60-70g  Fluid:  1.5L/day  EDUCATION NEEDS:   No education needs identified at this time  Clayton Bibles, MS, RD, LDN Pager: [phone] After Hours Pager: [phone]

## 2015-11-27 LAB — CREATININE, SERUM
CREATININE: 0.74 mg/dL (ref 0.44–1.00)
GFR calc Af Amer: >60 mL/min (ref >60)
GFR calc non Af Amer: >60 mL/min (ref >60)

## 2015-11-27 MED ORDER — HYDRALAZINE HCL 10 MG PO TABS: 10.0000 mg | ORAL_TABLET | Freq: Two times a day (BID) | ORAL | 0 refills | Status: DC | PRN

## 2015-11-27 NOTE — Progress Notes (Signed)
Patient is set to discharge to [facility] today. Insurance authorization - Armed forces training and education officer (policy #: [id number]) obtained (authorization #: [id number]). Patient & caregiver, Joelene Millin aware. Discharge packet given to RN, Joelene Millin. [facility] called for transport.     Raynaldo Opitz, [facility] Clinical Social Worker cell #: [phone]

## 2015-11-27 NOTE — Progress Notes (Signed)
Called SNF, [facility] at [phone], gave report to Theressa Millard to transport

## 2015-11-27 NOTE — Progress Notes (Signed)
Physical Therapy Treatment Patient Details Name: CORABELLE MEISENHEIMER MRN: [id number] DOB: Sep 17, 1929 Today's Date: 11/27/2015    History of Present Illness 82 y/o female admitted through ED with cough, weakness and SOB    PT Comments    Assisted OOB to amb a greater distance in hallway.  Unsteady gait.    Follow Up Recommendations  SNF     Equipment Recommendations       Recommendations for Other Services       Precautions / Restrictions Precautions Precautions: Fall Restrictions Weight Bearing Restrictions: No    Mobility  Bed Mobility Overal bed mobility: Needs Assistance Bed Mobility: Supine to Sit;Sit to Supine     Supine to sit: Min guard;HOB elevated Sit to supine: Min guard;HOB elevated   General bed mobility comments: increased time and use of rail  Transfers Overall transfer level: Needs assistance Equipment used: Rolling walker (2 wheeled);None Transfers: Sit to/from Stand Sit to Stand: Min assist Stand pivot transfers: Min assist       General transfer comment: Assist to rise, stabilize, control descent.   VC's for safety with turns and assisted in bathroom.    Ambulation/Gait Ambulation/Gait assistance: Min assist;Mod assist Ambulation Distance (Feet): 65 Feet Assistive device: Rolling walker (2 wheeled);None Gait Pattern/deviations: Step-to pattern;Step-through pattern;Decreased stride length Gait velocity: WFL   General Gait Details: Assist to stabilize intermittently. Pt tolerated distance well but normally does not amb with walker.  assisted with amb hand held assist pt demonstarted increased gait instability.  HIGH FALL RISK.   Stairs            Wheelchair Mobility    Modified Rankin (Stroke Patients Only)       Balance                                    Cognition Arousal/Alertness: Awake/alert Behavior During Therapy: WFL for tasks assessed/performed Overall Cognitive Status: Within Functional Limits for tasks  assessed                      Exercises      General Comments        Pertinent Vitals/Pain Pain Assessment: No/denies pain    Home Living                      Prior Function            PT Goals (current goals can now be found in the care plan section) Progress towards PT goals: Progressing toward goals    Frequency    Min 3X/week      PT Plan Current plan remains appropriate    Co-evaluation             End of Session Equipment Utilized During Treatment: Gait belt Activity Tolerance: Patient tolerated treatment well Patient left: in bed;with call bell/phone within reach;with bed alarm set     Time: [id number] PT Time Calculation (min) (ACUTE ONLY): 13 min  Charges:  $Gait Training: 8-22 mins                    G Codes:      Rica Koyanagi  PTA [facility]  Acute  Rehab Pager      [phone]

## 2015-11-27 NOTE — Discharge Summary (Addendum)
Physician Discharge Summary  Heather [name] [id number] DOB: [birth year] DOA: 11/20/2015  PCP: Unice Cobble, MD  Admit date: 11/20/2015 Discharge date: 11/26/2015  Recommendations for Outpatient Follow-up:  1. Pt will need to follow up with PCP in 1-1 weeks post discharge 2. Please obtain BMP to evaluate electrolytes and kidney function 3. Please also check CBC to evaluate Hg and Hct levels 4. Pt to complete zithromax post discharge  5. Pt also advised to follow up with Dr. Elsworth Soho pulmonologist in next two weeks, information provided below   Discharge Diagnoses:  Principal Problem:   Sepsis ([place]) Active Problems:   MAC (mycobacterium avium-intracellulare complex)   RAYNAUD'S DISEASE  Discharge Condition: Stable  Diet recommendation: Heart healthy diet discussed in details   History of present illness:  82 y.o.independent femaleon no medication with history of bronchiectasis and remote history of MAC lung infection who apparently has not been followed closely by her primary physician for several years was brought into the emergency department from home with cough, weakness and shortness of breath. She had been fairly independent prior to arrival and lives alone.  She was found on floor at home.  Pt diagnosed with CAP.  She subsequently develop episode of chest pain and SVT.    Assessment & Plan:   1. Severe sepsis secondary to presumed multifocal commmunity acquired pneumonia - Resolved now. Clinically patient is much improved. WBC is now WNL. 2. Community acquired pneumonia, multifocal - Given abnormal CT chest there is concern about progressive MAC infection, consulted pulmonologist, AFB smears negative, culture pending;  patient empirically started on Rocephin and Zithromax IV for CAP and clinically much better. Sputum cultures: normal respiratory flora. Pt wants to be discharged, Ok with discharge today and will provide script for Zithromax for pt to complete therapy for 4 more  days post discharge. Pt advised to have follow up with Dr. Elsworth Soho in next 1- 2 weeks.  3. SVT / ?paroxysmal AFIB - resolved with beta blocker administration, now on coreg 3.125 BID. Pt poor candidate for chronic anticoagulation. Echo with diastolic dysfunction but good systolic function.  No severe valvular abnormalities.  4. Hyponatremia-likely secondary to dehydration-improved with hydration.  5. Hypotension - Improved now.  Resolved.   6. Fall with Generalized weakness-PT recommending SNF. She has agreed to go to [facility] for rehabilitation.   7. Severe progressive Bronchiectasis with history of MAC infection-Cultures still pending at this time.  AFB smear negative.  Patient has not followed up recently with outpatient pulmonologist.  Appreciate pulmonary consult.  CT chest done reveals marked worsening of her condition.  8. Severe protein calorie malnutrition-consulted dietitian for recommendations. Pt tolerating diet well. 9. History of recurrent falls-as above PT recommending SNF. 10. Acute diastolic congestive heart failure - ECHO with stable EF and grade I diastolic CHF, resolved  11. Found on floor-Elevated CK levels noted, CK trending down with IVF hydration. 12. Positive blood culture: suspect that this was a contaminant: coag neg staph species.   DVT Prophylaxis:enoxaparin Code Status:DNR Disposition Plan:SNF Update: called several numbers, the only answer I got from her husband, Barnabas Lister who lives in [place] and he says that his wife does not want to talk to him, they are still married and he thinks he is still her POA   Consultants:  Pulmonary Elsworth Soho)  Procedures:  CTA  Procedures/Studies: Dg Chest 2 View  Result Date: 11/20/2015 CLINICAL DATA:  Cough EXAM: CHEST  2 VIEW COMPARISON:  12/23/2012 FINDINGS: The heart size is normal. Aortic  atherosclerosis and tortuosity is identified. There is mild interstitial edema in a small left pleural effusion. Airspace opacities are  identified within the right midlung and right upper lobe and left lower lobe. Chronic interstitial changes of COPD/emphysema noted. IMPRESSION: 1. Suspect mild CHF. 2. Bilateral multifocal airspace opacities are identified compatible with pneumonia. Followup PA and lateral chest X-ray is recommended in 3-4 weeks following trial of antibiotic therapy to ensure resolution and exclude underlying malignancy. Electronically Signed   By: Kerby Moors M.D.   On: 11/20/2015 16:58   Ct Angio Chest Pe W Or Wo Contrast  Result Date: 11/20/2015 CLINICAL DATA:  Elevated D-dimer. Tachycardia. Chest pain. Shortness of breath. EXAM: CT ANGIOGRAPHY CHEST WITH CONTRAST TECHNIQUE: Multidetector CT imaging of the chest was performed using the standard protocol during bolus administration of intravenous contrast. Multiplanar CT image reconstructions and MIPs were obtained to evaluate the vascular anatomy. CONTRAST:  100 cc Isovue 370 IV COMPARISON:  Chest radiograph earlier this day. High-resolution chest CT 06/02/2010, no interval imaging FINDINGS: Cardiovascular: There are no filling defects within the pulmonary arteries to suggest pulmonary embolus. The thoracic aorta is normal in caliber with mild atherosclerosis. A few coronary artery calcifications are seen. Mitral valvular calcifications are seen. Mediastinum/Nodes: Enlarged subcarinal lymph node measures 1.7 cm. Soft tissue density at the right hila without well-defined noted. An upper paratracheal node measures 10 mm. There is scattered esophageal wall thickening. No pericardial effusion. Lungs/Pleura: Cylindrical bronchiectasis within all lobes of both lungs has progressed from the prior exam. There is complete volume loss in the right middle lobe with whole lobe atelectasis, minimal aerated lung paramediastinal. Development of peripheral cavitary lesions in the right upper and lower lobes. In the right upper lobe cavitary process is complex, measuring at least 6.9 x 2.8  cm to encompass multiple adjacent cavitary densities. These lesions have a thick wall. More superiorly is subpleural cavitary formation with thick wall in the medial apex. In the lower lobe, cavitary process measures at least 3.3 x 2.5 cm with probable small fluid level. There fluid soft tissue density within the right lower lobe cavitary lesions. Bronchiectasis in the left lung is less severe, most prominent in the lingula and central lower lobe. There are peripheral consolidations in the left lower lobe and lingula appear confluent. Ground-glass and nodular opacities with tree in bud opacities are seen throughout the lungs. There is a small left pleural effusion. Multifocal right pleural thickening without discrete effusion. No definite septal thickening suggest pulmonary edema. Upper Abdomen: No acute abnormality. Musculoskeletal: Exaggerated thoracic kyphosis. There are no acute or suspicious osseous abnormalities. Review of the MIP images confirms the above findings. IMPRESSION: 1. No pulmonary embolus. 2. Marked progression since 2012 of bronchiectasis primarily in the right lung, with near complete atelectasis of the right middle lobe. Development of thick-walled cavitary lesions in the right upper and lower lobe which are contiguous with bronchiectasis. There is fluid and soft tissue density within the lower lobe cavitary lesions. Findings may be due to progressive infection versus malignancy. 3. Peripheral consolidations in the lingula and left lower lobe may be part of the background process versus acute pneumonia. These results will be called to the ordering clinician or representative by the Radiologist Assistant, and communication documented in the PACS or zVision Dashboard. Electronically Signed   By: Jeb Levering M.D.   On: 11/20/2015 23:48    Discharge Exam: Vitals:   11/27/15 0900 11/27/15 1040  BP: (!) 126/49 (!) 103/45  Pulse:  Resp:    Temp: 98.4 F (36.9 C)    Vitals:    11/27/15 0605 11/27/15 0834 11/27/15 0900 11/27/15 1040  BP: (!) 98/52 (!) 98/40 (!) 126/49 (!) 103/45  Pulse: 74     Resp: 14 16    Temp: 98.6 F (37 C) 98.4 F (36.9 C) 98.4 F (36.9 C)   TempSrc: Oral Oral Oral   SpO2: 95% 99%    Weight: 50.3 kg (110 lb 14.3 oz)     Height:        General: Pt is alert, follows commands appropriately, not in acute distress Cardiovascular: Regular rate and rhythm, S1/S2 +,  no rubs, no gallops Respiratory: no crackles, no rhonchi Abdominal: Soft, non tender, non distended, bowel sounds +, no guarding  Discharge Instructions  Discharge Instructions    Diet - low sodium heart healthy    Complete by:  As directed    Diet - low sodium heart healthy    Complete by:  As directed    Increase activity slowly    Complete by:  As directed    Increase activity slowly    Complete by:  As directed        Medication List    TAKE these medications   aspirin 325 MG EC tablet Take 1 tablet (325 mg total) by mouth daily.   atorvastatin 80 MG tablet Commonly known as:  LIPITOR Take 1 tablet (80 mg total) by mouth daily at 6 PM.   azithromycin 250 MG tablet Commonly known as:  ZITHROMAX Take 1 tablet (250 mg total) by mouth daily.   carvedilol 3.125 MG tablet Commonly known as:  COREG Take 1 tablet (3.125 mg total) by mouth 2 (two) times daily with a meal.   guaiFENesin 100 MG/5ML Soln Commonly known as:  ROBITUSSIN Take 5 mLs (100 mg total) by mouth every 4 (four) hours as needed for cough or to loosen phlegm.   ramipril 1.25 MG capsule Commonly known as:  ALTACE Take 1 capsule (1.25 mg total) by mouth 2 (two) times daily.        Contact information for follow-up providers    Unice Cobble, MD .   Specialty:  Internal Medicine Contact information: [place] [address] [phone]        Rigoberto Noel., MD. Schedule an appointment as soon as possible for a visit today.   Specialty:  Pulmonary Disease Contact  information: [address] [place] [address] [phone]            Contact information for after-discharge care    [facility] .   Specialty:  [place] information: [address] [place] [phone]                   The results of significant diagnostics from this hospitalization (including imaging, microbiology, ancillary and laboratory) are listed below for reference.     Microbiology: Recent Results (from the past 240 hour(s))  Urine culture     Status: None   Collection Time: 11/20/15  3:41 PM  Result Value Ref Range Status   Specimen Description URINE, RANDOM  Final   Special Requests NONE  Final   Culture NO GROWTH Performed at [facility]   Final   Report Status 11/22/2015 FINAL  Final  Blood Culture (routine x 2)     Status: Abnormal   Collection Time: 11/20/15  4:13 PM  Result Value Ref Range Status   Specimen Description BLOOD RIGHT ANTECUBITAL  Final   Special Requests BOTTLES DRAWN AEROBIC AND ANAEROBIC 5 CC EA  Final   Culture  Setup Time   Final    GRAM POSITIVE COCCI IN CLUSTERS IN BOTH AEROBIC AND ANAEROBIC BOTTLES CRITICAL RESULT CALLED TO, READ BACK BY AND VERIFIED WITH: T GREEN,PHARMD AT 1520 11/21/15 BY L BENFIELD    Culture (A)  Final    STAPHYLOCOCCUS SPECIES (COAGULASE NEGATIVE) THE SIGNIFICANCE OF ISOLATING THIS ORGANISM FROM A SINGLE SET OF BLOOD CULTURES WHEN MULTIPLE SETS ARE DRAWN IS UNCERTAIN. PLEASE NOTIFY THE MICROBIOLOGY DEPARTMENT WITHIN ONE WEEK IF SPECIATION AND SENSITIVITIES ARE REQUIRED. Performed at [facility]    Report Status 11/23/2015 FINAL  Final  Blood Culture ID Panel (Reflexed)     Status: Abnormal   Collection Time: 11/20/15  4:13 PM  Result Value Ref Range Status   Enterococcus species NOT DETECTED NOT DETECTED Final   Listeria monocytogenes NOT DETECTED NOT DETECTED Final   Staphylococcus species  DETECTED (A) NOT DETECTED Final    Comment: CRITICAL RESULT CALLED TO, READ BACK BY AND VERIFIED WITH: T GREEN,PHARMD AT 1520 11/21/15 BY L BENFIELD    Staphylococcus aureus NOT DETECTED NOT DETECTED Final   Methicillin resistance NOT DETECTED NOT DETECTED Final   Streptococcus species NOT DETECTED NOT DETECTED Final   Streptococcus agalactiae NOT DETECTED NOT DETECTED Final   Streptococcus pneumoniae NOT DETECTED NOT DETECTED Final   Streptococcus pyogenes NOT DETECTED NOT DETECTED Final   Acinetobacter baumannii NOT DETECTED NOT DETECTED Final   Enterobacteriaceae species NOT DETECTED NOT DETECTED Final   Enterobacter cloacae complex NOT DETECTED NOT DETECTED Final   Escherichia coli NOT DETECTED NOT DETECTED Final   Klebsiella oxytoca NOT DETECTED NOT DETECTED Final   Klebsiella pneumoniae NOT DETECTED NOT DETECTED Final   Proteus species NOT DETECTED NOT DETECTED Final   Serratia marcescens NOT DETECTED NOT DETECTED Final   Haemophilus influenzae NOT DETECTED NOT DETECTED Final   Neisseria meningitidis NOT DETECTED NOT DETECTED Final   Pseudomonas aeruginosa NOT DETECTED NOT DETECTED Final   Candida albicans NOT DETECTED NOT DETECTED Final   Candida glabrata NOT DETECTED NOT DETECTED Final   Candida krusei NOT DETECTED NOT DETECTED Final   Candida parapsilosis NOT DETECTED NOT DETECTED Final   Candida tropicalis NOT DETECTED NOT DETECTED Final    Comment: Performed at [facility]  Blood Culture (routine x 2)     Status: None   Collection Time: 11/20/15  8:20 PM  Result Value Ref Range Status   Specimen Description BLOOD RIGHT ARM  Final   Special Requests BOTTLES DRAWN AEROBIC AND ANAEROBIC [place]  Final   Culture   Final    NO GROWTH 5 DAYS Performed at [facility]    Report Status 11/25/2015 FINAL  Final  Acid Fast Smear (AFB)     Status: None   Collection Time: 11/21/15 12:35 PM  Result Value Ref Range Status   AFB Specimen Processing Concentration  Final    Acid Fast Smear Negative  Final    Comment: (NOTE) Performed At: [facility] [address] [place], [place] [id number] Lindon Romp MD [id number]    Source (AFB) SPUTUM  Final  Culture, respiratory (NON-Expectorated)     Status: None   Collection Time: 11/21/15 12:35 PM  Result Value Ref Range Status   Specimen Description TRACHEAL ASPIRATE  Final   Special Requests NONE  Final  Gram Stain   Final    NO WBC SEEN FEW SQUAMOUS EPITHELIAL CELLS PRESENT FEW GRAM POSITIVE COCCI IN PAIRS RARE GRAM VARIABLE ROD    Culture   Final    Consistent with normal respiratory flora. Performed at [facility]    Report Status 11/23/2015 FINAL  Final     Labs: Basic Metabolic Panel:  Recent Labs Lab 11/21/15 0241 11/22/15 0524 11/23/15 0411 11/24/15 0511 11/26/15 0544 11/27/15 0450  NA 130* 134* 133* 132* 135  --   K 3.5 3.8 3.4* 4.1 4.4  --   CL 100* 106 104 104 104  --   CO2 22 22 22 23 25   --   GLUCOSE 97 94 90 93 89  --   BUN 19 15 14 11 14   --   CREATININE 0.89 0.70 0.73 0.60 0.69 0.74  CALCIUM 8.1* 7.8* 7.2* 7.3* 8.4*  --    Liver Function Tests:  Recent Labs Lab 11/20/15 1608  AST 44*  ALT 18  ALKPHOS 96  BILITOT 1.0  PROT 8.7*  ALBUMIN 3.1*   No results for input(s): LIPASE, AMYLASE in the last 168 hours. No results for input(s): AMMONIA in the last 168 hours. CBC:  Recent Labs Lab 11/20/15 1608 11/21/15 0241 11/22/15 0520 11/23/15 0411 11/25/15 0507 11/26/15 0544  WBC 16.8* 16.4* 13.1* 12.7* 10.2 9.5  NEUTROABS 15.0* 13.9* 10.4* 9.6*  --   --   HGB 12.3 10.5* 9.3* 9.0* 9.6* 9.7*  HCT 39.0 32.6* 30.1* 27.5* 31.2* 31.5*  MCV 79.1 76.7* 78.6 76.2* 79.2 78.8  PLT 444* 400 455* 430* 473* 495*   Cardiac Enzymes:  Recent Labs Lab 11/20/15 2030 11/20/15 2106 11/20/15 2110 11/21/15 0241 11/21/15 0840 11/22/15 0524  CKTOTAL 683*  --  667* 511*  --  189  CKMB  --   --  24.8*  --   --   --   TROPONINI  --  0.03*  --  0.03*  0.03*  --    BNP: BNP (last 3 results)  Recent Labs  11/20/15 1608  BNP 272.0*    SIGNED: Time coordinating discharge:  30 minutes  Faye Ramsay, MD  [facility] 11/27/2015, 11:24 AM Pager [phone]  If 7PM-7AM, please contact night-coverage [email or web address] Password [email or web address]

## 2015-12-07 ENCOUNTER — Encounter: Payer: Self-pay | Admitting: Pulmonary Disease

## 2015-12-07 ENCOUNTER — Ambulatory Visit (INDEPENDENT_AMBULATORY_CARE_PROVIDER_SITE_OTHER)
Admission: RE | Admit: 2015-12-07 | Discharge: 2015-12-07 | Disposition: A | Discharge status: Home / Self Care | Payer: Medicare Other | Source: Ambulatory Visit | Attending: Pulmonary Disease | Admitting: Pulmonary Disease

## 2015-12-07 ENCOUNTER — Ambulatory Visit (INDEPENDENT_AMBULATORY_CARE_PROVIDER_SITE_OTHER): Payer: Self-pay | Admitting: Pulmonary Disease

## 2015-12-07 VITALS — BP 100/62 | HR 94 | Ht 64.0 in | Wt 109.8 lb

## 2015-12-07 DIAGNOSIS — J181 Lobar pneumonia, unspecified organism: Secondary | ICD-10-CM

## 2015-12-07 DIAGNOSIS — J189 Pneumonia, unspecified organism: Secondary | ICD-10-CM

## 2015-12-07 DIAGNOSIS — A310 Pulmonary mycobacterial infection: Secondary | ICD-10-CM

## 2015-12-07 NOTE — Assessment & Plan Note (Signed)
It is very likely that she has recurrent MAC. Sputum AFB culture is negative so far- 2 weeks out We will obtain another sputum Given her overall failure to thrive, mild memory impairment and DO NOT RESUSCITATE status I would prefer to be conservative. We'll defer bronchoscopy for now and revisit in 4-6 weeks

## 2015-12-07 NOTE — Progress Notes (Signed)
Subjective:    Patient ID: Heather [name], female    DOB: 1929-09-25, 82 y.o.   MRN: [id number]  HPI  82 year old with prior history of bronchiectasis and remote history of MAC infection for follow-up  Chart review shows that she followed with Dr. Joya Gaskins in 2013 for recurrent MAC infection and was on triple therapy . Surprisingly patient does not recall ever being told about this diagnosis. Review of imaging shows infiltrates and bronchiectasis in the same areas back in 2013-but markedly worse on the current CT with cavitary infiltrates   Chief Complaint  Patient presents with  . Hospitalization Follow-up    patient was seen in hospital on 11/20/15 for CAP.  Here for follow up, feeling better, still coughing up yellow phlegm, no chest tightness.     She was living independently at home admitted 9/15 after being found down on the floor. She reported generalized weakness for 3 days with a productive cough and mild shortness of breath. ED evaluation showed a mild lactic acidosis with leukocytosis and chest x-ray showed bilateral infiltrates-treated as CAP. Due to positive d-dimer a CT angiogram was performed which showed markedly progression of bronchiectasis and thick-walled cavitary lesions in the right upper and lower lobe there was also peripheral consolidation in the lingula and left lung.  She was treated for CAP with antibiotics and discharged to a rehabilitation facility She is improved, is off oxygen and able to ambulate. She continues to have memory issues  She continues to report a productive cough with yellow sputum   SIGNIFICANT EVENTS  9/15 SVT-given metoprolol and Coreg started, negative troponin  CT angiogram 9/15 >>markedly progression of bronchiectasis and thick-walled cavitary lesions in the right upper and lower lobe there was also peripheral consolidation in the lingula and left lung Echo 9/16 nml LV fn   Past Medical History:  Diagnosis Date  . ANA positive 2007     1:640  . Bronchiectasis   . Dry eyes    and photosensitivity  . Fibroids uterine  . Hemoptysis   . Hiatal hernia    presentation as chest pain  . Osteopenia   . Pulmonary disease due to mycobacteria [facility])   . Pulmonary infiltrates april 2008   persistent ct infiltrates  . Raynaud's disease   . Spastic dysphonia    Botox therapy, Dr Okey Regal  . Tremor, essential     Review of Systems  Constitutional: Negative for chills, fever and unexpected weight change.  HENT: Negative for congestion, dental problem, ear pain, nosebleeds, postnasal drip, rhinorrhea, sinus pressure, sneezing, sore throat, trouble swallowing and voice change.   Eyes: Negative for visual disturbance.  Respiratory: Positive for cough and shortness of breath. Negative for choking.   Cardiovascular: Negative for chest pain and leg swelling.  Gastrointestinal: Negative for abdominal pain, diarrhea and vomiting.  Genitourinary: Negative for difficulty urinating.  Musculoskeletal: Negative for arthralgias.  Skin: Negative for rash.  Neurological: Negative for tremors, syncope and headaches.  Hematological: Does not bruise/bleed easily.       Objective:   Physical Exam  Gen. Pleasant, cachectic, in no distress, normal affect, sitting in a wheelchair  ENT - no lesions, no post nasal drip Neck: No JVD, no thyromegaly, no carotid bruits Lungs: no use of accessory muscles, no dullness to percussion, clear without rales or rhonchi  Cardiovascular: Rhythm regular, heart sounds  normal, no murmurs or gallops, no peripheral edema Abdomen: soft and non-tender, no hepatosplenomegaly, BS normal. Musculoskeletal: No deformities, no cyanosis or  clubbing Neuro:  alert, non focal,        Assessment & Plan:

## 2015-12-07 NOTE — Patient Instructions (Signed)
Chest x-ray today Obtain sputum AFB Flu shot and Prevnar if this was not given already

## 2015-12-07 NOTE — Assessment & Plan Note (Signed)
Chest x-ray follow-up for improvement/resolution

## 2015-12-18 ENCOUNTER — Telehealth: Payer: Self-pay | Admitting: General Practice

## 2015-12-18 NOTE — Telephone Encounter (Signed)
Patient use to be established with Dr. Linna [name].  She had a few visited scheduled to establish care with Terri Piedra that she either cancelled or no showed.  Social Worker states patient currently does not have PCP and needs to follow up with a PCP after nursing home stay.  Would you be willing to work patient in to be seen soon?

## 2015-12-18 NOTE — Telephone Encounter (Signed)
Was she dismissed from practice after several no shows or cancellations?

## 2015-12-21 NOTE — Telephone Encounter (Signed)
You can schedule her with me. But please remind patient of our no show and cancellation policy. Thank you

## 2015-12-21 NOTE — Telephone Encounter (Signed)
Left vm stating to call back to schedule.  Told Heather [name] we could work patient in sooner.  Did leave message in regard to no shows on vm to encourage patient to make appointments or to cancel in appropriate time.   Also,  Included that after 3 no shows the patient can be dismissed from the practice.

## 2015-12-21 NOTE — Telephone Encounter (Signed)
She has not been dismissed.

## 2015-12-30 ENCOUNTER — Inpatient Hospital Stay: Payer: PRIVATE HEALTH INSURANCE | Admitting: Pulmonary Disease

## 2016-01-05 LAB — ACID FAST CULTURE WITH REFLEXED SENSITIVITIES (MYCOBACTERIA)

## 2016-01-05 LAB — ACID FAST CULTURE WITH REFLEXED SENSITIVITIES: ACID FAST CULTURE - AFSCU3: NEGATIVE

## 2016-01-18 ENCOUNTER — Ambulatory Visit: Payer: PRIVATE HEALTH INSURANCE | Admitting: Pulmonary Disease

## 2016-04-04 ENCOUNTER — Emergency Department (HOSPITAL_COMMUNITY): Payer: Medicare (Managed Care)

## 2016-04-04 ENCOUNTER — Emergency Department (HOSPITAL_COMMUNITY)
Admission: EM | Admit: 2016-04-04 | Discharge: 2016-04-04 | Disposition: A | Discharge status: Home / Self Care | Payer: Medicare (Managed Care) | Attending: Emergency Medicine | Admitting: Emergency Medicine

## 2016-04-04 ENCOUNTER — Encounter (HOSPITAL_COMMUNITY): Payer: Self-pay | Admitting: Emergency Medicine

## 2016-04-04 DIAGNOSIS — T25122A Burn of first degree of left foot, initial encounter: Secondary | ICD-10-CM | POA: Diagnosis present

## 2016-04-04 DIAGNOSIS — Z87891 Personal history of nicotine dependence: Secondary | ICD-10-CM | POA: Diagnosis not present

## 2016-04-04 DIAGNOSIS — X19XXXA Contact with other heat and hot substances, initial encounter: Secondary | ICD-10-CM | POA: Diagnosis not present

## 2016-04-04 DIAGNOSIS — Y939 Activity, unspecified: Secondary | ICD-10-CM | POA: Insufficient documentation

## 2016-04-04 DIAGNOSIS — Z7982 Long term (current) use of aspirin: Secondary | ICD-10-CM | POA: Diagnosis not present

## 2016-04-04 DIAGNOSIS — I509 Heart failure, unspecified: Secondary | ICD-10-CM | POA: Insufficient documentation

## 2016-04-04 DIAGNOSIS — T25221A Burn of second degree of right foot, initial encounter: Secondary | ICD-10-CM | POA: Diagnosis not present

## 2016-04-04 DIAGNOSIS — T25229A Burn of second degree of unspecified foot, initial encounter: Secondary | ICD-10-CM

## 2016-04-04 DIAGNOSIS — Y999 Unspecified external cause status: Secondary | ICD-10-CM | POA: Insufficient documentation

## 2016-04-04 DIAGNOSIS — Z79899 Other long term (current) drug therapy: Secondary | ICD-10-CM | POA: Insufficient documentation

## 2016-04-04 DIAGNOSIS — T25222A Burn of second degree of left foot, initial encounter: Secondary | ICD-10-CM | POA: Diagnosis not present

## 2016-04-04 DIAGNOSIS — I471 Supraventricular tachycardia: Secondary | ICD-10-CM | POA: Insufficient documentation

## 2016-04-04 DIAGNOSIS — Y92008 Other place in unspecified non-institutional (private) residence as the place of occurrence of the external cause: Secondary | ICD-10-CM | POA: Insufficient documentation

## 2016-04-04 LAB — CBC WITH DIFFERENTIAL/PLATELET
BASOS ABS: 0 10*3/uL (ref 0.0–0.1)
BASOS PCT: 0 %
EOS PCT: 1 %
Eosinophils Absolute: 0.1 10*3/uL (ref 0.0–0.7)
HCT: 38.8 % (ref 36.0–46.0)
Hemoglobin: 12.3 g/dL (ref 12.0–15.0)
Lymphocytes Relative: 14 %
Lymphs Abs: 1.3 10*3/uL (ref 0.7–4.0)
MCH: 26.7 pg (ref 26.0–34.0)
MCHC: 31.7 g/dL (ref 30.0–36.0)
MCV: 84.3 fL (ref 78.0–100.0)
MONO ABS: 1.3 10*3/uL — AB (ref 0.1–1.0)
MONOS PCT: 14 %
NEUTROS ABS: 6.6 10*3/uL (ref 1.7–7.7)
Neutrophils Relative %: 71 %
PLATELETS: 251 10*3/uL (ref 150–400)
RBC: 4.6 MIL/uL (ref 3.87–5.11)
RDW: 15.4 % (ref 11.5–15.5)
WBC: 9.3 10*3/uL (ref 4.0–10.5)

## 2016-04-04 LAB — COMPREHENSIVE METABOLIC PANEL
ALBUMIN: 3.2 g/dL — AB (ref 3.5–5.0)
ALK PHOS: 57 U/L (ref 38–126)
ALT: 12 U/L — ABNORMAL LOW (ref 14–54)
ANION GAP: 10 (ref 5–15)
AST: 22 U/L (ref 15–41)
BILIRUBIN TOTAL: 0.6 mg/dL (ref 0.3–1.2)
BUN: 27 mg/dL — ABNORMAL HIGH (ref 6–20)
CALCIUM: 9.1 mg/dL (ref 8.9–10.3)
CO2: 24 mmol/L (ref 22–32)
Chloride: 100 mmol/L — ABNORMAL LOW (ref 101–111)
Creatinine, Ser: 0.96 mg/dL (ref 0.44–1.00)
GFR, EST NON AFRICAN AMERICAN: 53 mL/min — AB (ref >60)
GLUCOSE: 121 mg/dL — AB (ref 65–99)
Potassium: 4.1 mmol/L (ref 3.5–5.1)
Sodium: 134 mmol/L — ABNORMAL LOW (ref 135–145)
TOTAL PROTEIN: 8.3 g/dL — AB (ref 6.5–8.1)

## 2016-04-04 LAB — I-STAT CG4 LACTIC ACID, ED: Lactic Acid, Venous: 1.7 mmol/L (ref 0.5–1.9)

## 2016-04-04 MED ORDER — SILVER SULFADIAZINE 1 % EX CREA: 1.0000 "application " | TOPICAL_CREAM | Freq: Two times a day (BID) | CUTANEOUS | 1 refills | Status: AC

## 2016-04-04 MED ORDER — SILVER SULFADIAZINE 1 % EX CREA
TOPICAL_CREAM | Freq: Two times a day (BID) | CUTANEOUS | Status: DC
  Administered 2016-04-04: 18:00:00 via TOPICAL
  Filled 2016-04-04: qty 50

## 2016-04-04 NOTE — ED Notes (Signed)
Bed: WA17 Expected date:  Expected time:  Means of arrival:  Comments: Pt upstairs

## 2016-04-04 NOTE — Progress Notes (Signed)
Entered in d/c instructions and gave pt list of [place] transportation agencies  Please use the list of [place] transportation services to get to and from your appointment     Use the list of agencies provided to you by the ED case manager    Next Steps: Go on 04/04/2016    Instructions: Call today or tomorrow to schedule your appointment transportation As soon as possible     [facility]    [phone] [address] [place] [phone]    Next Steps: Go on 04/13/2016    Instructions: You have been provided the earliest wound care appointment available Your appointment is at [phone] Unfortunately there is not an appointment later in the day for convenience. You were able to get this appointment because someone cancelled     [facility]   [phone] [address] [place] [address]    Next Steps: Call on 04/05/2016    Instructions: This is the agency that will assist you with Home health nurse for the care of your feet and physical therapy for assisting you with evaluation of further equipment and mobility needs and treatment

## 2016-04-04 NOTE — Progress Notes (Signed)
Spoke with [facility] to provide referral for HHPT and RN services Discussed pcp and upcoming wound care center appt on 04/15/16 at 0915 set by EDP  Pt states she wanted a later wound care center appt (no a morning person) but EDP states appt was a cancellation and later appt not available Pt updated

## 2016-04-04 NOTE — Progress Notes (Signed)
   04/04/16 0000  CM Assessment  Expected Discharge [place]  In-house Referral NA  Discharge Planning Services CM Consult  [facility]  Choice offered to / list presented to  Patient  DME Arranged [place]  Status of Service Completed, signed off  Discharge [place]   Pt is seen by home helpers 4 hours a day pcp is [employer] and sees Dr Joya Gaskins as pulmonology

## 2016-04-04 NOTE — Progress Notes (Signed)
Requested ED RN to please place [facility] care givers number in [facility]
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
## 2016-04-04 NOTE — ED Triage Notes (Signed)
[facility] for cellulitis to bilateral lower extremities. Per caregiver pt hx of psoriasis worsening over past 2-3 days; denies fever.

## 2016-04-04 NOTE — Progress Notes (Addendum)
ED CM received a return call from [place] at [facility] at [place] 04/04/16  Updated her that pt needs [facility] and PT and has an appt 04/13/16 at [place] with wound care clinic Lattie Haw states with pt memory issues she only relates well with Joelene Millin her care giver and Joelene Millin can assist her to get to her 04/13/16 appt  States pt with a hx of having a husband in [place] that pays all pt bills Pt "goes to get her hair done every Thursday in the same outfit" Updated [facility] that they will need to call or have Joelene Millin from Home keepers present to get in pt home Pt allows people in home with [place] only

## 2016-04-04 NOTE — Progress Notes (Addendum)
   04/04/16 0000  CM Assessment  Expected Discharge [place]  In-house Referral NA  Discharge Planning Services CM Consult  [facility] Choice Home Health  Choice offered to / list presented to  Patient  [facility];RN;PT  [place]  Status of Service Completed, signed off  Discharge Disposition Home w [place] from [place] at bedside Cm called Home keepers to and left [place] Cm visited home keepers on line to confirm they are personal care services only  CM reviewed in details medicare guidelines, Choices of home health [facility]) (length of stay in home, types of [facility] staff available, coverage, primary caregiver, up to 24 hrs before services may be started) and choices of Private duty nursing (PDN-coverage, length of stay in the home types of staff available).  CM provided ptwith a list of [place] home health agencies and choice connections pamphlet   Pt choice of home health agency is [facility]

## 2016-04-05 ENCOUNTER — Telehealth: Payer: Self-pay | Admitting: *Deleted

## 2016-04-13 ENCOUNTER — Encounter (HOSPITAL_BASED_OUTPATIENT_CLINIC_OR_DEPARTMENT_OTHER): Payer: Medicare Other

## 2016-04-27 ENCOUNTER — Encounter (HOSPITAL_BASED_OUTPATIENT_CLINIC_OR_DEPARTMENT_OTHER): Payer: Medicare (Managed Care) | Attending: Surgery

## 2016-04-27 DIAGNOSIS — Z79899 Other long term (current) drug therapy: Secondary | ICD-10-CM | POA: Insufficient documentation

## 2016-04-27 DIAGNOSIS — I890 Lymphedema, not elsewhere classified: Secondary | ICD-10-CM | POA: Insufficient documentation

## 2016-04-27 DIAGNOSIS — I7300 Raynaud's syndrome without gangrene: Secondary | ICD-10-CM | POA: Diagnosis not present

## 2016-04-27 DIAGNOSIS — I429 Cardiomyopathy, unspecified: Secondary | ICD-10-CM | POA: Insufficient documentation

## 2016-04-27 DIAGNOSIS — Z7982 Long term (current) use of aspirin: Secondary | ICD-10-CM | POA: Insufficient documentation

## 2016-04-27 DIAGNOSIS — Z87891 Personal history of nicotine dependence: Secondary | ICD-10-CM | POA: Diagnosis not present

## 2016-04-27 DIAGNOSIS — I5042 Chronic combined systolic (congestive) and diastolic (congestive) heart failure: Secondary | ICD-10-CM | POA: Insufficient documentation

## 2016-04-27 DIAGNOSIS — Z86718 Personal history of other venous thrombosis and embolism: Secondary | ICD-10-CM | POA: Insufficient documentation

## 2016-04-27 DIAGNOSIS — L97522 Non-pressure chronic ulcer of other part of left foot with fat layer exposed: Secondary | ICD-10-CM | POA: Diagnosis present

## 2016-05-11 ENCOUNTER — Encounter (HOSPITAL_BASED_OUTPATIENT_CLINIC_OR_DEPARTMENT_OTHER): Payer: Medicare (Managed Care) | Attending: Surgery

## 2016-05-11 DIAGNOSIS — L97521 Non-pressure chronic ulcer of other part of left foot limited to breakdown of skin: Secondary | ICD-10-CM | POA: Diagnosis not present

## 2016-05-11 DIAGNOSIS — Z86718 Personal history of other venous thrombosis and embolism: Secondary | ICD-10-CM | POA: Diagnosis not present

## 2016-05-11 DIAGNOSIS — L98491 Non-pressure chronic ulcer of skin of other sites limited to breakdown of skin: Secondary | ICD-10-CM | POA: Diagnosis not present

## 2017-08-05 DEATH — deceased

## 2017-12-04 IMAGING — CT CT ANGIO CHEST
2 of 6 series · 17 of 36 positions shown · IV contrast (isovue)
Comparison: Chest radiograph earlier this day. High-resolution
chest CT 06/02/2010, no interval imaging

CLINICAL DATA: Elevated D-dimer. Tachycardia. Chest pain. Shortness
of breath.

EXAM:
CT ANGIOGRAPHY CHEST WITH CONTRAST
TECHNIQUE: Multidetector CT imaging of the chest was performed using the
standard protocol during bolus administration of intravenous
contrast. Multiplanar CT image reconstructions and MIPs were
obtained to evaluate the vascular anatomy.
CONTRAST:  100 cc Isovue 370 IV

[Series 6: pe thins @ 1mm · axial · 0.59mm/px · z∈[-320,-61]mm · 16 of 289 slices shown]
[im 15/289  lung]
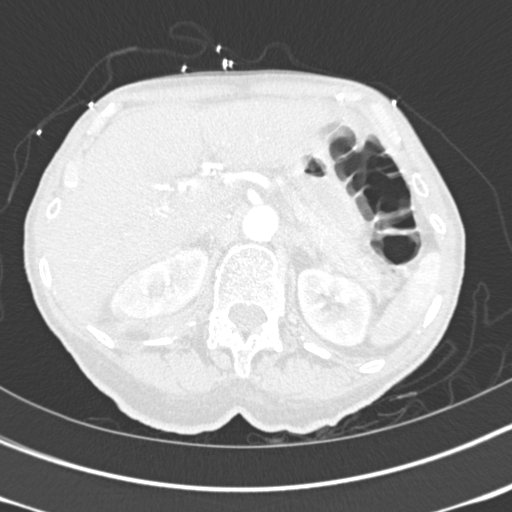
[im 29/289  mediastinal]
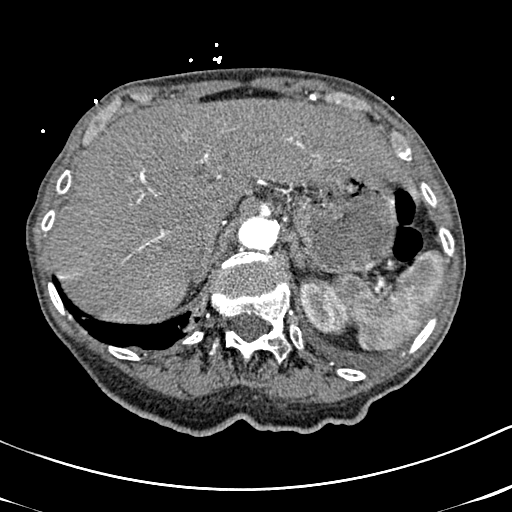
[im 44/289  lung]
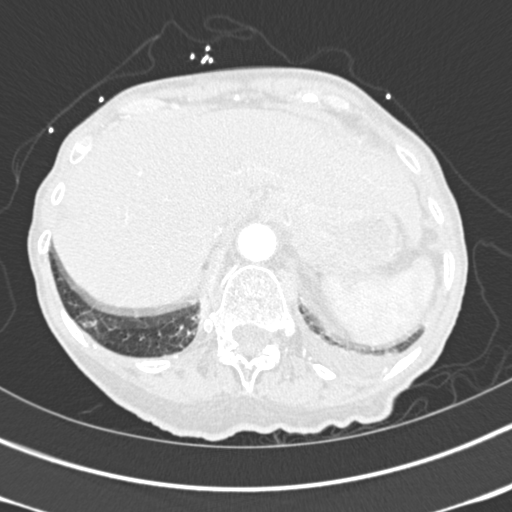
[im 73/289  mediastinal]
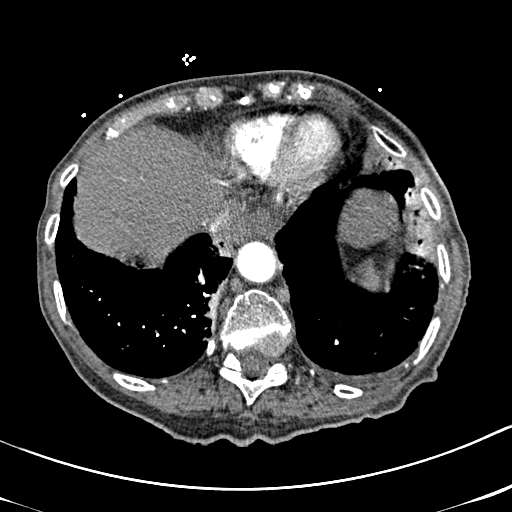
[im 87/289  lung]
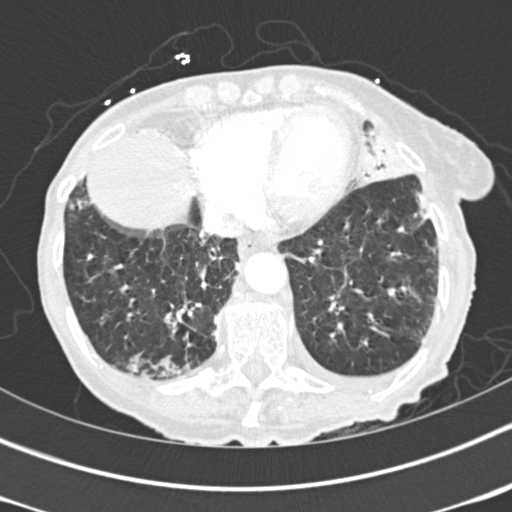
[im 101/289  mediastinal]
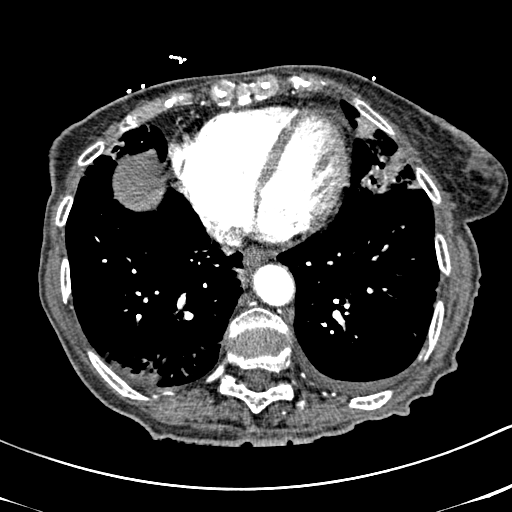
[im 116/289  lung]
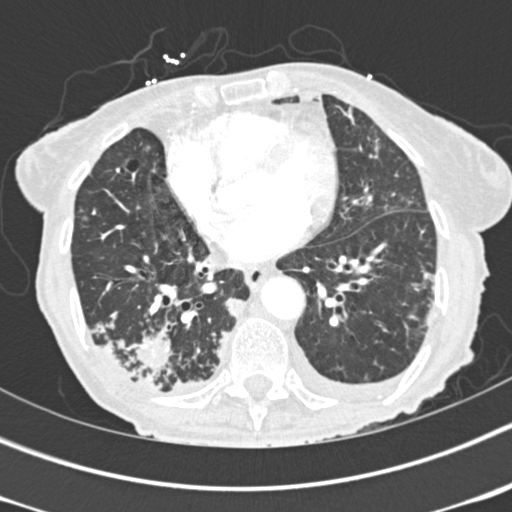
[im 130/289  mediastinal]
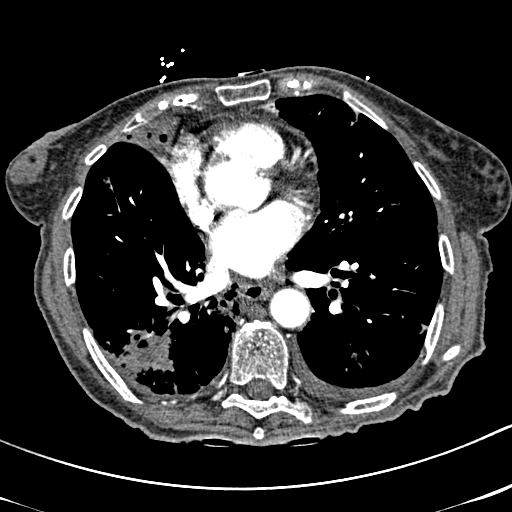
[im 159/289  lung]
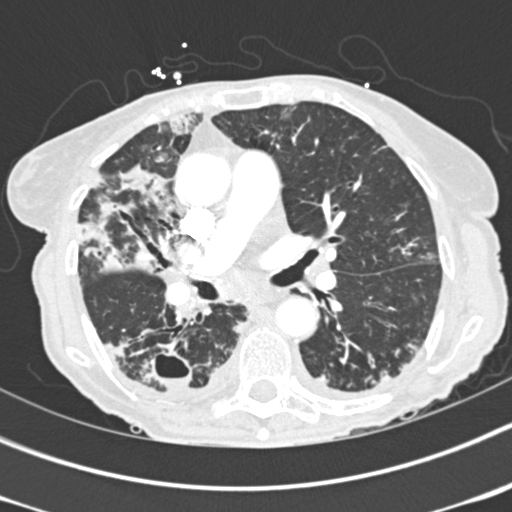
[im 173/289  mediastinal]
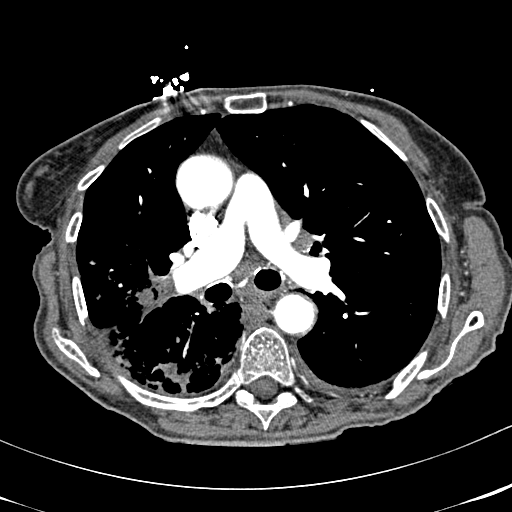
[im 188/289  lung]
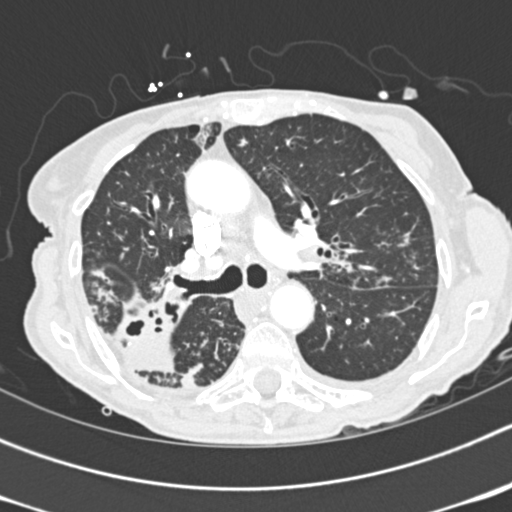
[im 202/289  mediastinal]
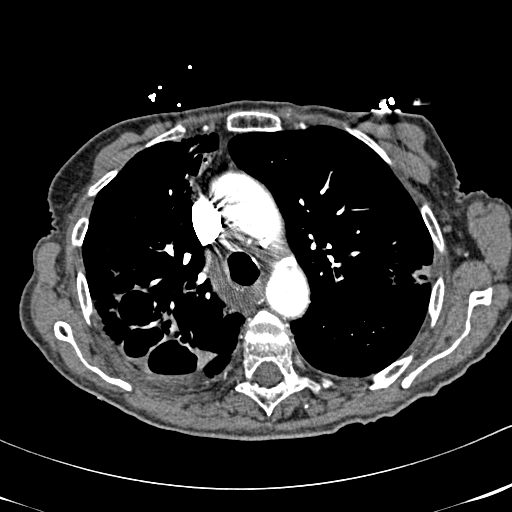
[im 217/289  lung]
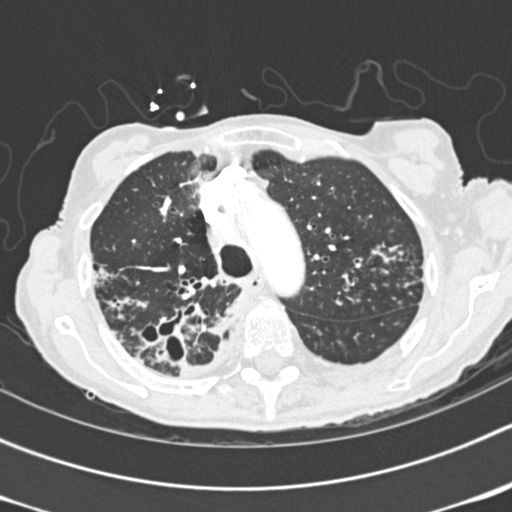
[im 245/289  mediastinal]
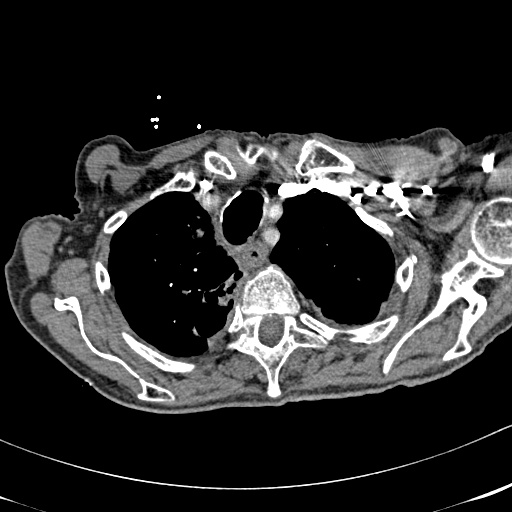
[im 260/289  lung]
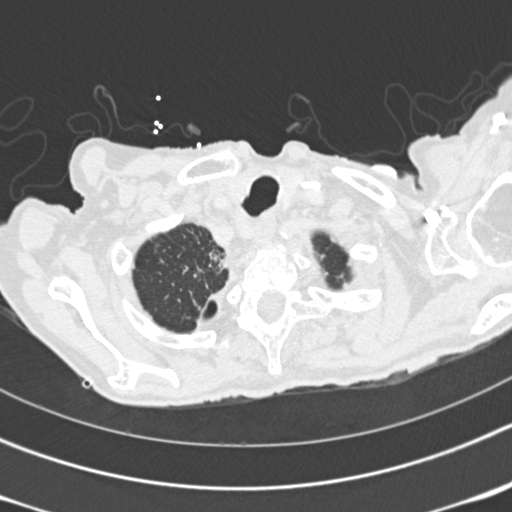
[im 274/289  mediastinal]
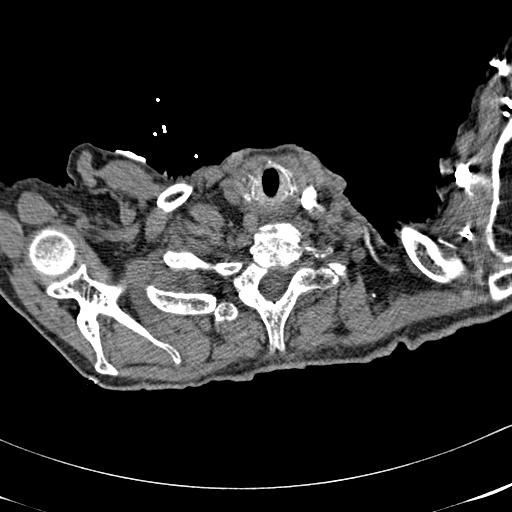

[Series 603: <mpr thick range(1)> · coronal · 0.59mm/px · 1 of 103 slices shown]
[im 52/103  mediastinal]
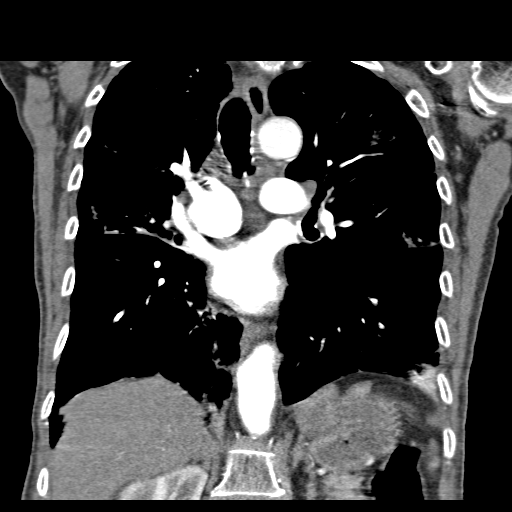

[17 of 36 positions shown; findings below may reference images not displayed]

FINDINGS: Cardiovascular: There are no filling defects within the pulmonary
arteries to suggest pulmonary embolus. The thoracic aorta is normal
in caliber with mild atherosclerosis. A few coronary artery
calcifications are seen. Mitral valvular calcifications are seen.

Mediastinum/Nodes: Enlarged subcarinal lymph node measures 1.7 cm.
Soft tissue density at the right hila without well-defined noted. An
upper paratracheal node measures 10 mm. There is scattered
esophageal wall thickening. No pericardial effusion.

Lungs/Pleura: Cylindrical bronchiectasis within all lobes of both
lungs has progressed from the prior exam. There is complete volume
loss in the right middle lobe with whole lobe atelectasis, minimal
aerated lung paramediastinal. Development of peripheral cavitary
lesions in the right upper and lower lobes. In the right upper lobe
cavitary process is complex, measuring at least 6.9 x 2.8 cm to
encompass multiple adjacent cavitary densities. These lesions have a
thick wall. More superiorly is subpleural cavitary formation with
thick wall in the medial apex. In the lower lobe, cavitary process
measures at least 3.3 x 2.5 cm with probable small fluid level.
There fluid soft tissue density within the right lower lobe cavitary
lesions. Bronchiectasis in the left lung is less severe, most
prominent in the lingula and central lower lobe. There are
peripheral consolidations in the left lower lobe and lingula appear
confluent. Ground-glass and nodular opacities with tree in bud
opacities are seen throughout the lungs. There is a small left
pleural effusion. Multifocal right pleural thickening without
discrete effusion. No definite septal thickening suggest pulmonary
edema.

Upper Abdomen: No acute abnormality.

Musculoskeletal: Exaggerated thoracic kyphosis. There are no acute
or suspicious osseous abnormalities.

Review of the MIP images confirms the above findings.
IMPRESSION: 1. No pulmonary embolus.
2. Marked progression since 6296 of bronchiectasis primarily in the
right lung, with near complete atelectasis of the right middle lobe.
Development of thick-walled cavitary lesions in the right upper and
lower lobe which are contiguous with bronchiectasis. There is fluid
and soft tissue density within the lower lobe cavitary lesions.
Findings may be due to progressive infection versus malignancy.
3. Peripheral consolidations in the lingula and left lower lobe may
be part of the background process versus acute pneumonia.

These results will be called to the ordering clinician or
representative by the Radiologist Assistant, and communication
documented in the PACS or zVision Dashboard.

## 2017-12-04 IMAGING — CR DG CHEST 2V
2 series · 2 of 2 positions shown · non-contrast
Comparison: 12/23/2012

CLINICAL DATA: Cough

EXAM:
CHEST  2 VIEW

[x chest ap]
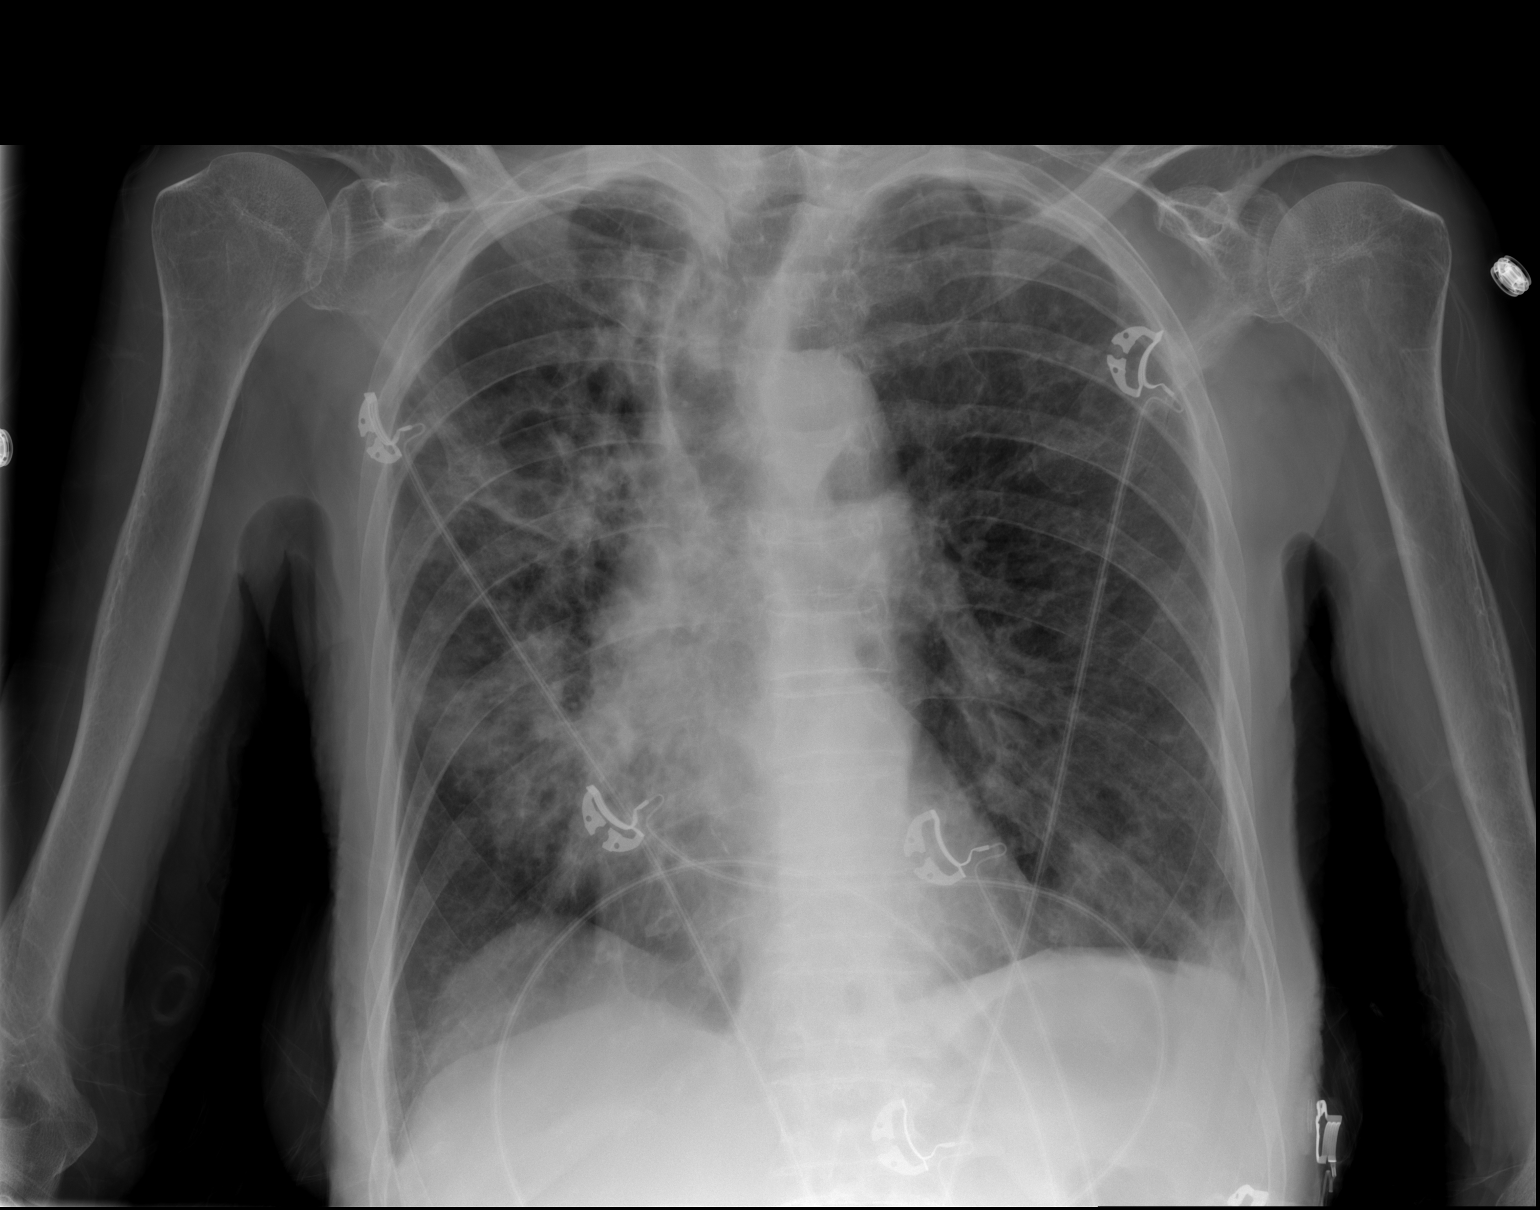

[w chest lat]
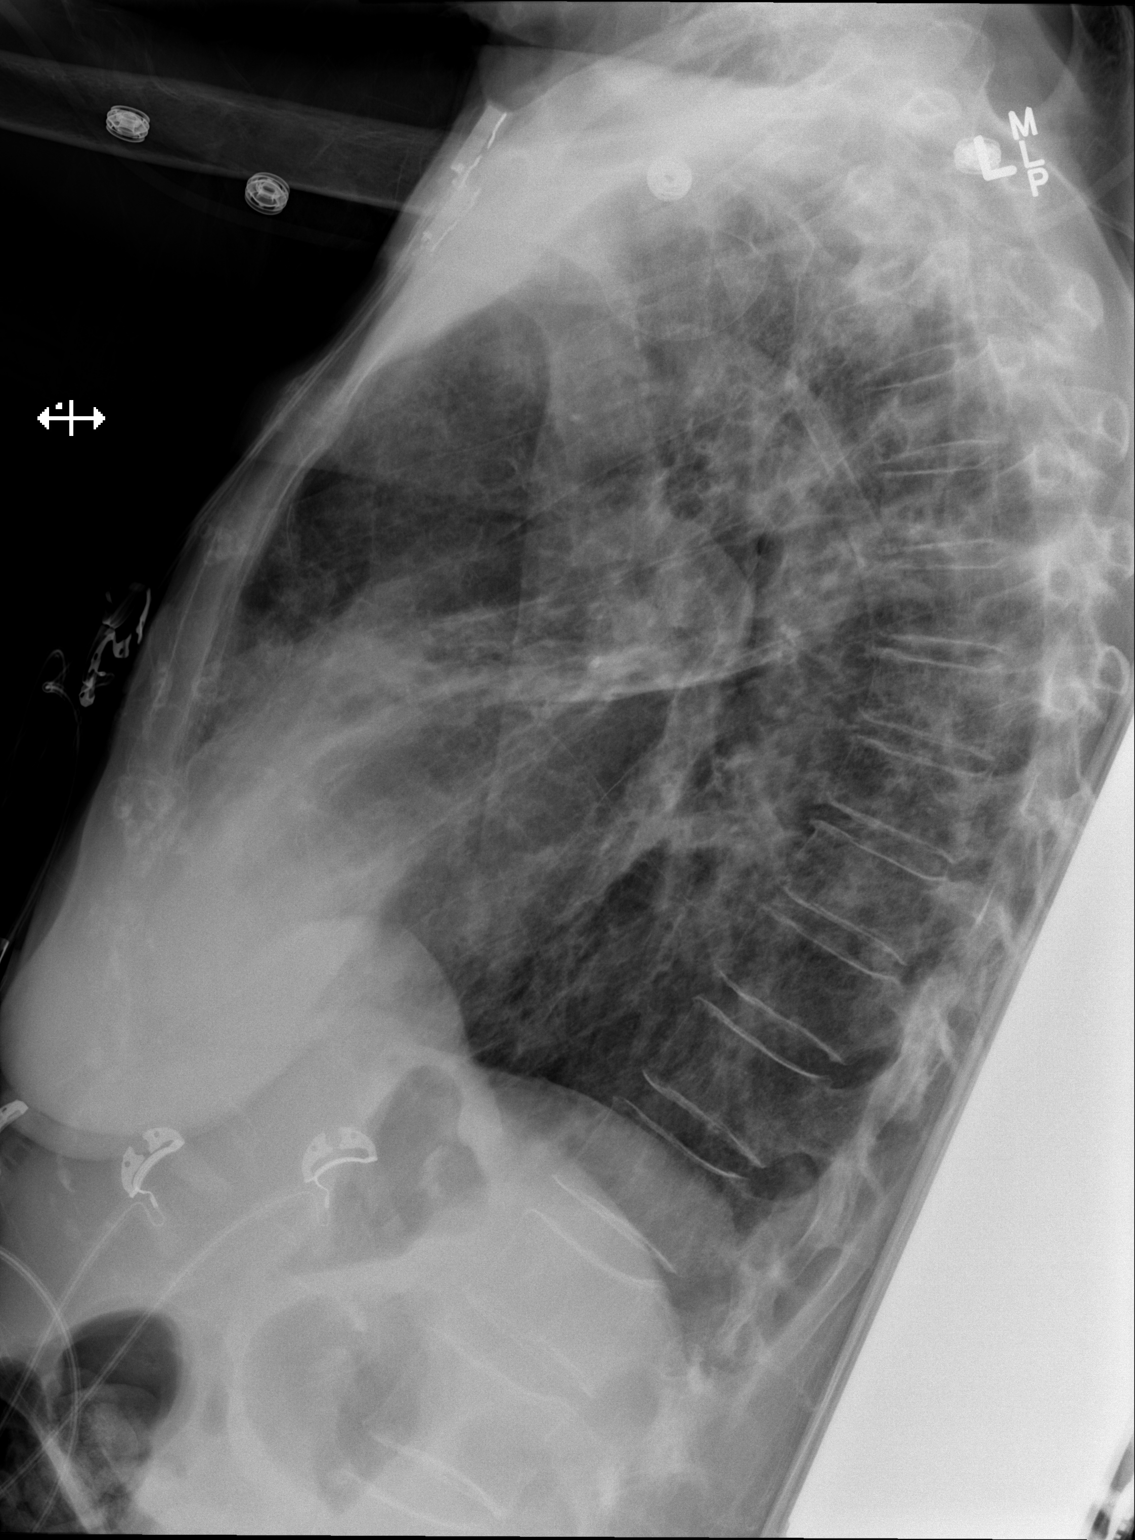

[2 of 2 positions shown; findings below may reference images not displayed]

FINDINGS: The heart size is normal. Aortic atherosclerosis and tortuosity is
identified. There is mild interstitial edema in a small left pleural
effusion. Airspace opacities are identified within the right midlung
and right upper lobe and left lower lobe. Chronic interstitial
changes of COPD/emphysema noted.
IMPRESSION: 1. Suspect mild CHF.
2. Bilateral multifocal airspace opacities are identified compatible
with pneumonia.
Followup PA and lateral chest X-ray is recommended in 3-4 weeks
following trial of antibiotic therapy to ensure resolution and
exclude underlying malignancy.
